# Patient Record
Sex: Female | Born: 1995 | Hispanic: Yes | Smoking: Never smoker
Health system: Southern US, Community
[De-identification: ages and names within clinical notes are randomized; demographics above are authoritative.]

## PROBLEM LIST (undated history)

## (undated) ENCOUNTER — Inpatient Hospital Stay: Payer: Self-pay

## (undated) DIAGNOSIS — Z87440 Personal history of urinary (tract) infections: Secondary | ICD-10-CM

## (undated) DIAGNOSIS — Z789 Other specified health status: Secondary | ICD-10-CM

## (undated) HISTORY — PX: NO PAST SURGERIES: SHX2092

## (undated) HISTORY — DX: Personal history of urinary (tract) infections: Z87.440

## (undated) HISTORY — PX: OTHER SURGICAL HISTORY: SHX169

---
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
## 2018-04-21 LAB — HM PAP SMEAR

## 2019-02-25 ENCOUNTER — Ambulatory Visit: Payer: Self-pay

## 2019-05-06 ENCOUNTER — Ambulatory Visit (LOCAL_COMMUNITY_HEALTH_CENTER): Payer: Self-pay

## 2019-05-06 ENCOUNTER — Other Ambulatory Visit: Payer: Self-pay

## 2019-05-06 VITALS — BP 104/61 | Ht 60.0 in | Wt 115.0 lb

## 2019-05-06 DIAGNOSIS — Z3201 Encounter for pregnancy test, result positive: Secondary | ICD-10-CM

## 2019-05-06 DIAGNOSIS — Z3200 Encounter for pregnancy test, result unknown: Secondary | ICD-10-CM

## 2019-05-06 LAB — PREGNANCY, URINE: Preg Test, Ur: POSITIVE — AB

## 2019-05-06 MED ORDER — PRENATAL VITAMIN 27-0.8 MG PO TABS: 1.0000 | ORAL_TABLET | Freq: Every day | ORAL | 0 refills | Status: DC

## 2019-05-11 NOTE — Progress Notes (Signed)
Pt with positive UPT. Pt desires care at [facility] and desires preadmit. Pt sent up front for preadmit per pt request.Netanel Yannuzzi Eleonore Chiquito, RN

## 2019-06-01 ENCOUNTER — Encounter: Payer: Self-pay | Admitting: *Deleted

## 2019-06-01 ENCOUNTER — Other Ambulatory Visit: Payer: Self-pay

## 2019-06-01 DIAGNOSIS — Z3A09 9 weeks gestation of pregnancy: Secondary | ICD-10-CM | POA: Insufficient documentation

## 2019-06-01 DIAGNOSIS — R1030 Lower abdominal pain, unspecified: Secondary | ICD-10-CM | POA: Insufficient documentation

## 2019-06-01 DIAGNOSIS — N3000 Acute cystitis without hematuria: Secondary | ICD-10-CM | POA: Insufficient documentation

## 2019-06-01 DIAGNOSIS — O219 Vomiting of pregnancy, unspecified: Secondary | ICD-10-CM | POA: Insufficient documentation

## 2019-06-01 LAB — COMPREHENSIVE METABOLIC PANEL
ALT: 19 U/L (ref 0–44)
AST: 19 U/L (ref 15–41)
Albumin: 4.4 g/dL (ref 3.5–5.0)
Alkaline Phosphatase: 55 U/L (ref 38–126)
Anion gap: 12 (ref 5–15)
BUN: 6 mg/dL (ref 6–20)
CO2: 21 mmol/L — ABNORMAL LOW (ref 22–32)
Calcium: 9.5 mg/dL (ref 8.9–10.3)
Chloride: 102 mmol/L (ref 98–111)
Creatinine, Ser: 0.43 mg/dL — ABNORMAL LOW (ref 0.44–1.00)
GFR calc Af Amer: >60 mL/min (ref >60)
GFR calc non Af Amer: >60 mL/min (ref >60)
Glucose, Bld: 79 mg/dL (ref 70–99)
Potassium: 3.8 mmol/L (ref 3.5–5.1)
Sodium: 135 mmol/L (ref 135–145)
Total Bilirubin: 0.7 mg/dL (ref 0.3–1.2)
Total Protein: 8 g/dL (ref 6.5–8.1)

## 2019-06-01 LAB — CBC
HCT: 38.3 % (ref 36.0–46.0)
Hemoglobin: 13.3 g/dL (ref 12.0–15.0)
MCH: 29.6 pg (ref 26.0–34.0)
MCHC: 34.7 g/dL (ref 30.0–36.0)
MCV: 85.1 fL (ref 80.0–100.0)
Platelets: 164 10*3/uL (ref 150–400)
RBC: 4.5 MIL/uL (ref 3.87–5.11)
RDW: 12.1 % (ref 11.5–15.5)
WBC: 7 10*3/uL (ref 4.0–10.5)
nRBC: 0 % (ref 0.0–0.2)

## 2019-06-01 LAB — URINALYSIS, COMPLETE (UACMP) WITH MICROSCOPIC
Bilirubin Urine: NEGATIVE
Glucose, UA: NEGATIVE mg/dL
Hgb urine dipstick: NEGATIVE
Ketones, ur: 80 mg/dL — AB
Nitrite: POSITIVE — AB
Protein, ur: 30 mg/dL — AB
Specific Gravity, Urine: 1.027 (ref 1.005–1.030)
pH: 6 (ref 5.0–8.0)

## 2019-06-01 LAB — LIPASE, BLOOD: Lipase: 36 U/L (ref 11–51)

## 2019-06-01 LAB — HCG, QUANTITATIVE, PREGNANCY: hCG, Beta Chain, Quant, S: 195954 m[IU]/mL — ABNORMAL HIGH (ref <5)

## 2019-06-01 NOTE — ED Triage Notes (Signed)
Pt is pregnant approx 10 weeks.  Pt reports n/v for 2 weeks.  No vaginal bleeding.  No urinary sx.  Pt has low abd pain.  Pt alert  Speech clear.  Interpreter on a stick used.

## 2019-06-02 ENCOUNTER — Emergency Department: Payer: Self-pay

## 2019-06-02 ENCOUNTER — Emergency Department
Admission: EM | Admit: 2019-06-02 | Discharge: 2019-06-02 | Disposition: A | Discharge status: Home / Self Care | Payer: Self-pay | Attending: Emergency Medicine | Admitting: Emergency Medicine

## 2019-06-02 DIAGNOSIS — O219 Vomiting of pregnancy, unspecified: Secondary | ICD-10-CM

## 2019-06-02 DIAGNOSIS — R1030 Lower abdominal pain, unspecified: Secondary | ICD-10-CM

## 2019-06-02 DIAGNOSIS — N3000 Acute cystitis without hematuria: Secondary | ICD-10-CM

## 2019-06-02 LAB — WET PREP, GENITAL
Clue Cells Wet Prep HPF POC: NONE SEEN
Sperm: NONE SEEN
Trich, Wet Prep: NONE SEEN
Yeast Wet Prep HPF POC: NONE SEEN

## 2019-06-02 MED ORDER — METOCLOPRAMIDE HCL 10 MG PO TABS
10.0000 mg | ORAL_TABLET | Freq: Once | ORAL | Status: AC
  Administered 2019-06-02: 10 mg via ORAL
  Filled 2019-06-02: qty 1

## 2019-06-02 MED ORDER — NITROFURANTOIN MONOHYD MACRO 100 MG PO CAPS
100.0000 mg | ORAL_CAPSULE | Freq: Once | ORAL | Status: AC
  Administered 2019-06-02: 100 mg via ORAL
  Filled 2019-06-02: qty 1

## 2019-06-02 MED ORDER — NITROFURANTOIN MONOHYD MACRO 100 MG PO CAPS
100.0000 mg | ORAL_CAPSULE | Freq: Two times a day (BID) | ORAL | 0 refills | Status: AC
Start: 2019-06-02 — End: 2019-06-09

## 2019-06-02 MED ORDER — METOCLOPRAMIDE HCL 10 MG PO TABS: 10.0000 mg | ORAL_TABLET | Freq: Three times a day (TID) | ORAL | 0 refills | Status: DC | PRN

## 2019-06-02 NOTE — ED Notes (Signed)
E-signature not working at this time. Pt verbalized understanding of D/C instructions, prescriptions and follow up care with no further questions at this time. Pt in NAD and ambulatory at time of D/C.  

## 2019-06-02 NOTE — ED Provider Notes (Signed)
[facility] Emergency Department Provider Note  ____________________________________________  Time seen: Approximately 2:50 AM  I have reviewed the triage vital signs and the nursing notes.   HISTORY  Chief Complaint Emesis   HPI Norma NephewClaudia Wessells is a 23 y.o. female G2, P1 at estimated [redacted] weeks gestational age per LMP who presents for evaluation of nausea, vomiting and abdominal pain.  Her symptoms have been ongoing for 2 weeks.  She is complaining of constant nausea that is worse in the morning, associated with several daily episodes of nonbloody nonbilious emesis.  She also describes intermittent pressure-like pain in the suprapubic region that last several seconds to a few minutes at a time.  No vaginal discharge or vaginal bleeding.  Patient has not established care for this pregnancy yet.  No dysuria or hematuria, no fever or chills.  Past Medical History:  Diagnosis Date  . Medical history non-contributory     Patient Active Problem List   Diagnosis Date Noted  . Premature uterine contractions 02/05/2015  . Abdominal pain in pregnancy 02/05/2015  . Normal labor 02/05/2015  . Indication for care in labor and delivery, antepartum 01/15/2015    Past Surgical History:  Procedure Laterality Date  . NO PAST SURGERIES      Prior to Admission medications   Medication Sig Start Date End Date Taking? Authorizing Provider  ibuprofen (ADVIL,MOTRIN) 600 MG tablet Take 1 tablet (600 mg total) by mouth every 6 (six) hours. Patient not taking: Reported on 05/06/2019 02/08/15   Schermerhorn, Ihor Austinhomas J, MD  medroxyPROGESTERone (DEPO-PROVERA) 150 MG/ML injection Inject 150 mg into the muscle every 3 (three) months. 07/07/18 10/27/18  [provider]  metoCLOPramide (REGLAN) 10 MG tablet Take 1 tablet (10 mg total) by mouth every 8 (eight) hours as needed for up to 3 days for nausea. 06/02/19 06/05/19  Nita SickleVeronese, [place], MD  nitrofurantoin,  macrocrystal-monohydrate, (MACROBID) 100 MG capsule Take 1 capsule (100 mg total) by mouth 2 (two) times daily for 7 days. 06/02/19 06/09/19  Nita SickleVeronese, [place], MD  Prenatal Vit-Fe Fumarate-FA (PRENATAL VITAMIN) 27-0.8 MG TABS Take 1 tablet by mouth daily. 05/06/19 08/14/19  Federico FlakeNewton, Kimberly Niles, MD    Allergies Patient has no known allergies.  No family history on file.  Social History Social History   Tobacco Use  . Smoking status: Never Smoker  . Smokeless tobacco: Never Used  Substance Use Topics  . Alcohol use: No  . Drug use: No    Review of Systems  Constitutional: Negative for fever. Eyes: Negative for visual changes. ENT: Negative for sore throat. Neck: No neck pain  Cardiovascular: Negative for chest pain. Respiratory: Negative for shortness of breath. Gastrointestinal: + suprapubic abdominal pain, nausea, and vomiting. No diarrhea. Genitourinary: Negative for dysuria. Musculoskeletal: Negative for back pain. Skin: Negative for rash. Neurological: Negative for headaches, weakness or numbness. Psych: No SI or HI  ____________________________________________   PHYSICAL EXAM:  VITAL SIGNS: ED Triage Vitals  Enc Vitals Group     BP 06/01/19 1946 115/71     Pulse Rate 06/01/19 1946 96     Resp 06/01/19 1946 18     Temp 06/01/19 1946 99.4 F (37.4 C)     Temp Source 06/01/19 1946 Oral     SpO2 06/01/19 1946 100 %     Weight 06/01/19 1954 115 lb (52.2 kg)     Height 06/01/19 1954 5\' 4"  (1.626 m)     Head Circumference --      Peak Flow --  Pain Score 06/01/19 1954 8     Pain Loc --      Pain Edu? --      Excl. in [place]? --     Constitutional: Alert and oriented. Well appearing and in no apparent distress. HEENT:      Head: Normocephalic and atraumatic.         Eyes: Conjunctivae are normal. Sclera is non-icteric.       Mouth/Throat: Mucous membranes are moist.       Neck: Supple with no signs of meningismus. Cardiovascular: Regular rate and  rhythm. No murmurs, gallops, or rubs. 2+ symmetrical distal pulses are present in all extremities. No JVD. Respiratory: Normal respiratory effort. Lungs are clear to auscultation bilaterally. No wheezes, crackles, or rhonchi.  Gastrointestinal: Soft, non tender, and non distended with positive bowel sounds. No rebound or guarding. Pelvic exam: Normal external genitalia, no rashes or lesions. Normal cervical mucus. Os closed. No cervical motion tenderness.  No uterine or adnexal tenderness.   Musculoskeletal: Nontender with normal range of motion in all extremities. No edema, cyanosis, or erythema of extremities. Neurologic: Normal speech and language. Face is symmetric. Moving all extremities. No gross focal neurologic deficits are appreciated. Skin: Skin is warm, dry and intact. No rash noted. Psychiatric: Mood and affect are normal. Speech and behavior are normal.  ____________________________________________   LABS (all labs ordered are listed, but only abnormal results are displayed)  Labs Reviewed  COMPREHENSIVE METABOLIC PANEL - Abnormal; Notable for the following components:      Result Value   CO2 21 (*)    Creatinine, Ser 0.43 (*)    All other components within normal limits  URINALYSIS, COMPLETE (UACMP) WITH MICROSCOPIC - Abnormal; Notable for the following components:   Color, Urine YELLOW (*)    APPearance HAZY (*)    Ketones, ur 80 (*)    Protein, ur 30 (*)    Nitrite POSITIVE (*)    Leukocytes,Ua TRACE (*)    Bacteria, UA RARE (*)    All other components within normal limits  HCG, QUANTITATIVE, PREGNANCY - Abnormal; Notable for the following components:   hCG, Beta Chain, Quant, S 195,954 (*)    All other components within normal limits  URINE CULTURE  WET PREP, GENITAL  CHLAMYDIA/NGC RT PCR ([facility] ONLY)  LIPASE, BLOOD  CBC   ____________________________________________  EKG  none  ____________________________________________  RADIOLOGY  I have  personally reviewed the images performed during this visit and I agree with the Radiologist's read.   Interpretation by Radiologist:  US Ob Less Than 14 Weeks With Ob Transvaginal  Result Date: 06/02/2019 CLINICAL DATA:  Lower abdominal pain for 1 day EXAM: OBSTETRIC <14 WK [place] AND TRANSVAGINAL OB US TECHNIQUE: Both transabdominal and transvaginal ultrasound examinations were performed for complete evaluation of the gestation as well as the maternal uterus, adnexal regions, and pelvic cul-de-sac. Transvaginal technique was performed to assess early pregnancy. COMPARISON:  None. FINDINGS: LMP: 03/23/2019 GA by LMP: 10 w  1 d EDC by LMP: 12/28/2019 Intrauterine gestational sac: Single Yolk sac:  Visualized. Embryo:  Visualized. Cardiac Activity: Visualized. Heart Rate: 169 bpm CRL:  27.2 mm   9 w   4 d                  [place] EDC: 01/01/2020 Subchorionic hemorrhage:  None visualized. Maternal uterus/adnexae: Probable corpus luteum seen in the left ovary measuring up to 1.9 cm. Ovaries and adnexal structures are otherwise unremarkable. Maternal uterus is free  of acute abnormality. No free fluid IMPRESSION: Single intrauterine gestation at 9 weeks 4 days estimated gestational age by crown-rump length measurement. Electronically Signed   By: Kreg Shropshire M.D.   On: 06/02/2019 02:01     ____________________________________________   PROCEDURES  Procedure(s) performed: None Procedures Critical Care performed:  None ____________________________________________   INITIAL IMPRESSION / ASSESSMENT AND PLAN / ED COURSE  23 y.o. female G70, P1 at estimated [redacted] weeks gestational age per LMP who presents for evaluation of nausea, vomiting and suprapubic abdominal pain.  Patient is extremely well-appearing in no distress with normal vital signs, abdomen is soft with no tenderness, pelvic exam is normal.  No vaginal bleeding. Transvaginal ultrasound showing a single IUP at 9 weeks and 4 days with no complications.   Wet prep, chlamydia and gonorrhea are pending.  UA positive for UTI.  No signs of sepsis.  Urine was sent for culture. Patient started on macrobid. Reglan given for nausea and vomiting most likely due to pregnancy. Discussed prenatal care.  Patient has an appointment to establish care on December 1.  Discussed my standard return precautions for new or worsening abdominal pain, flank pain, fever, intractable nausea and vomiting.       As part of my medical decision making, I reviewed the following data within the electronic MEDICAL RECORD NUMBER Nursing notes reviewed and incorporated, Labs reviewed , Old chart reviewed, Radiograph reviewed , Notes from prior ED visits and [place] Controlled Substance Database   Please note:  Patient was evaluated in Emergency Department today for the symptoms described in the history of present illness. Patient was evaluated in the context of the global COVID-19 pandemic, which necessitated consideration that the patient might be at risk for infection with the SARS-CoV-2 virus that causes COVID-19. Institutional protocols and algorithms that pertain to the evaluation of patients at risk for COVID-19 are in a state of rapid change based on information released by regulatory bodies including the CDC and federal and state organizations. These policies and algorithms were followed during the patient's care in the ED.  Some ED evaluations and interventions may be delayed as a result of limited staffing during the pandemic.   ____________________________________________   FINAL CLINICAL IMPRESSION(S) / ED DIAGNOSES   Final diagnoses:  Nausea and vomiting during pregnancy  Acute cystitis without hematuria      NEW MEDICATIONS STARTED DURING THIS VISIT:  ED Discharge Orders         Ordered    metoCLOPramide (REGLAN) 10 MG tablet  Every 8 hours PRN     06/02/19 0255    nitrofurantoin, macrocrystal-monohydrate, (MACROBID) 100 MG capsule  2 times daily     06/02/19 0255            Note:  This document was prepared using Dragon voice recognition software and may include unintentional dictation errors.    Nita Sickle, MD 06/02/19 [phone]

## 2019-06-02 NOTE — ED Notes (Signed)
Pt to u/s via w/c accomp by u/s tech 

## 2019-06-03 LAB — URINE CULTURE: Culture: >=100000 — AB

## 2019-06-05 LAB — GC/CHLAMYDIA PROBE AMP
Chlamydia trachomatis, NAA: NEGATIVE
Neisseria Gonorrhoeae by PCR: NEGATIVE

## 2019-06-09 ENCOUNTER — Ambulatory Visit: Payer: Medicaid Other | Admitting: Advanced Practice Midwife

## 2019-06-09 ENCOUNTER — Other Ambulatory Visit: Payer: Self-pay

## 2019-06-09 ENCOUNTER — Encounter: Payer: Self-pay | Admitting: Advanced Practice Midwife

## 2019-06-09 VITALS — BP 100/65 | Temp 99.1°F | Wt 105.0 lb

## 2019-06-09 DIAGNOSIS — Z23 Encounter for immunization: Secondary | ICD-10-CM

## 2019-06-09 DIAGNOSIS — O0990 Supervision of high risk pregnancy, unspecified, unspecified trimester: Secondary | ICD-10-CM | POA: Insufficient documentation

## 2019-06-09 DIAGNOSIS — O2341 Unspecified infection of urinary tract in pregnancy, first trimester: Secondary | ICD-10-CM

## 2019-06-09 DIAGNOSIS — Z3481 Encounter for supervision of other normal pregnancy, first trimester: Secondary | ICD-10-CM

## 2019-06-09 DIAGNOSIS — R636 Underweight: Secondary | ICD-10-CM | POA: Insufficient documentation

## 2019-06-09 DIAGNOSIS — O2340 Unspecified infection of urinary tract in pregnancy, unspecified trimester: Secondary | ICD-10-CM | POA: Insufficient documentation

## 2019-06-09 LAB — URINALYSIS
Bilirubin, UA: POSITIVE — AB
Glucose, UA: NEGATIVE
Leukocytes,UA: NEGATIVE
Nitrite, UA: NEGATIVE
RBC, UA: NEGATIVE
Specific Gravity, UA: 1.03 (ref 1.005–1.030)
Urobilinogen, Ur: 0.2 mg/dL (ref 0.2–1.0)
pH, UA: 5.5 (ref 5.0–7.5)

## 2019-06-09 LAB — WET PREP FOR TRICH, YEAST, CLUE
Trichomonas Exam: NEGATIVE
Yeast Exam: NEGATIVE

## 2019-06-09 LAB — HEMOGLOBIN, FINGERSTICK: Hemoglobin: 12.8 g/dL (ref 11.1–15.9)

## 2019-06-09 LAB — OB RESULTS CONSOLE HIV ANTIBODY (ROUTINE TESTING): HIV: NONREACTIVE

## 2019-06-09 MED ORDER — ONDANSETRON HCL 8 MG PO TABS: 8.0000 mg | ORAL_TABLET | Freq: Three times a day (TID) | ORAL | 0 refills | Status: DC | PRN

## 2019-06-09 NOTE — Progress Notes (Signed)
Patient here for new OB visit at 75 1/7. Phone interview done previously by R. Lassiter. Negative PPD 12/24/2014, no international travel since then. [facility] ED 06/02/2019 due to N/V, U/S done at that time. Also GC/Chl. Done and UTI diagnosed and treatment. Patient states she has finished UTI medication.Jenetta Downer, RN

## 2019-06-09 NOTE — Progress Notes (Signed)
[place] [id number] [phone]  INITIAL PRENATAL VISIT NOTE  Subjective:  Norma Becker is a 23 y.o. G3P1011 at [redacted]w[redacted]d being seen today to start prenatal care at the [facility] Department. 23 yo nonsmoking SHF feels "good" about surprise pregnancy with no birth control.  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 She is unemployed and living with her parents and child. Last ETOH 02/2019 (1 [place]).  Been to ER x1 06/02/19 and given Reglan which she says is not working, and diagnosed with UTI and has finished Macrobid.  Had 1 u/s then at 9 4/7 wks.  Pap due 2022.  She is currently monitored for the following issues for this low-risk pregnancy and has Underweight BMI=18.0; UTI (urinary tract infection) during pregnancy dx'd [facility] ER 06/02/19; and Supervision of normal intrauterine pregnancy in multigravida in first trimester on their problem list.  Patient reports no complaints & N&V--last vomited 3x today.  Has only eaten crackers today.  Contractions: Not present. Vag. Bleeding: None.  Movement: Absent. Denies leaking of fluid.   The following portions of the patient's history were reviewed and updated as appropriate: allergies, current medications, past family history, past medical history, past social history, past surgical history and problem list. Problem list updated.  Objective:   Vitals:   06/09/19 1404  BP: 100/65  Temp: 99.1 F (37.3 C)  Weight: 105 lb (47.6 kg)    Fetal Status: Fetal Heart Rate (bpm): 160 Fundal Height: 10 cm Movement: Absent      Physical Exam Vitals signs and nursing note reviewed.  Constitutional:      General: She is not in acute distress.    Appearance: Normal appearance. She is well-developed.  HENT:     Head: Normocephalic and atraumatic.     Right Ear: External  ear normal.     Left Ear: External ear normal.     Nose: Nose normal. No congestion or rhinorrhea.     Mouth/Throat:     Lips: Pink.     Mouth: Mucous membranes are moist.     Dentition: Normal dentition. No dental caries.     Pharynx: Oropharynx is clear. Uvula midline.  Eyes:     General: No scleral icterus.    Conjunctiva/sclera: Conjunctivae normal.  Neck:     Thyroid: No thyroid mass or thyromegaly.  Cardiovascular:     Rate and Rhythm: Normal rate.     Pulses: Normal pulses.     Comments: Extremities are warm and well perfused Pulmonary:     Effort: Pulmonary effort is normal.     Breath sounds: Normal breath sounds.  Chest:     Breasts: Breasts are symmetrical.        Right: Normal. No mass, nipple discharge or skin change.        Left: Normal. No mass, nipple discharge or skin change.  Abdominal:     General: Abdomen is flat.     Palpations: Abdomen is soft.     Tenderness: There is no abdominal tenderness.     Comments: Gravid 10 wks size, FHR 160, soft without tenderness, +striae, poor tone  Genitourinary:    General: Normal vulva.     Exam position: Lithotomy position.     Pubic Area: No rash.      Labia:        Right: No  rash.        Left: No rash.      Vagina: Vaginal discharge (white mucousy, ph<4.5) present.     Cervix: No cervical motion tenderness or friability.     Uterus: Normal. Enlarged (Gravid 10-12 weeks size). Not tender.      Adnexa: Right adnexa normal and left adnexa normal.     Rectum: Normal. No external hemorrhoid.  Musculoskeletal:     Right lower leg: No edema.     Left lower leg: No edema.  Lymphadenopathy:     Upper Body:     Right upper body: No axillary adenopathy.     Left upper body: No axillary adenopathy.  Skin:    General: Skin is warm.     Capillary Refill: Capillary refill takes less than 2 seconds.  Neurological:     Mental Status: She is alert.     Assessment and Plan:  Pregnancy: G3P1011 at [redacted]w[redacted]d  1. Underweight  BMI=18.0   2. Urinary tract infection in mother during first trimester of pregnancy Dx'd in [facility] ER 06/02/19 and completed Macrobid.  C&S TOC today  3. Supervision of normal intrauterine pregnancy in multigravida in first trimester C/o N&V daily (3x today) and has only eaten crackers today--suggestions given and rx for Zofran #12. - Prenatal profile without Varicella/Rubella ([id number]) - HIV [place] [facility] - Urine Culture - WET PREP FOR TRICH, YEAST, CLUE - Urinalysis (Urine Dip) - Hemoglobin, venipuncture - ondansetron (ZOFRAN) 8 MG tablet; Take 1 tablet (8 mg total) by mouth every 8 (eight) hours as needed for nausea or vomiting.  Dispense: 12 tablet; Refill: 0    Discussed overview of care and coordination with inpatient delivery practices including [facility], [facility], [facility] and [facility].   Reviewed Centering pregnancy as standard of care at [facility], oriented to room and showed video. Based on EDD, plan for Cycle .    Preterm labor symptoms and general obstetric precautions including but not limited to vaginal bleeding, contractions, leaking of fluid and fetal movement were reviewed in detail with the patient.  Please refer to After Visit Summary for other counseling recommendations.   Return in about 4 weeks (around 07/07/2019) for routine PNC.  No future appointments.  Alberteen Spindle, CNM

## 2019-06-09 NOTE — Progress Notes (Addendum)
Flu vaccine given, right deltoid, tolerated well, VIS given. Written prescription for Zofran copied for scanning. NCIR in [employer] traffic [employer]. Wet mount reviewed, no treatment indicated.Jenetta Downer, RN

## 2019-06-10 LAB — CBC/D/PLT+RPR+RH+ABO+AB SCR
Antibody Screen: NEGATIVE
Basophils Absolute: 0 10*3/uL (ref 0.0–0.2)
Basos: 0 %
EOS (ABSOLUTE): 0 10*3/uL (ref 0.0–0.4)
Eos: 0 %
Hematocrit: 38.4 % (ref 34.0–46.6)
Hemoglobin: 13 g/dL (ref 11.1–15.9)
Hepatitis B Surface Ag: NEGATIVE
Immature Grans (Abs): 0 10*3/uL (ref 0.0–0.1)
Immature Granulocytes: 0 %
Lymphocytes Absolute: 1.5 10*3/uL (ref 0.7–3.1)
Lymphs: 23 %
MCH: 29.9 pg (ref 26.6–33.0)
MCHC: 33.9 g/dL (ref 31.5–35.7)
MCV: 88 fL (ref 79–97)
Monocytes Absolute: 0.3 10*3/uL (ref 0.1–0.9)
Monocytes: 5 %
Neutrophils Absolute: 4.5 10*3/uL (ref 1.4–7.0)
Neutrophils: 72 %
Platelets: 182 10*3/uL (ref 150–450)
RBC: 4.35 x10E6/uL (ref 3.77–5.28)
RDW: 12.5 % (ref 11.7–15.4)
RPR Ser Ql: NONREACTIVE
Rh Factor: POSITIVE
WBC: 6.4 10*3/uL (ref 3.4–10.8)

## 2019-06-10 NOTE — Addendum Note (Signed)
Addended by: Donnal Moat on: 06/10/2019 10:04 AM   Modules accepted: Orders

## 2019-06-11 LAB — URINE CULTURE

## 2019-06-18 ENCOUNTER — Telehealth: Payer: Self-pay | Admitting: Dietician

## 2019-06-18 ENCOUNTER — Encounter: Payer: Self-pay | Admitting: Dietician

## 2019-06-18 NOTE — Telephone Encounter (Signed)
Spoke with client via telephone due to MNT referral for underweight in pregnancy.  Encouraged eating 2-3 times daily, including good fats, adequate hydration/fiber, and reviewed tips for managing nausea.  Client not billed for MNT services due to required [facility] contacts unmet.  See [facility] EHR for notes.

## 2019-07-07 ENCOUNTER — Other Ambulatory Visit: Payer: Self-pay

## 2019-07-07 ENCOUNTER — Encounter: Payer: Self-pay | Admitting: Family Medicine

## 2019-07-07 ENCOUNTER — Ambulatory Visit: Payer: Self-pay | Admitting: Family Medicine

## 2019-07-07 VITALS — BP 97/59 | HR 83 | Temp 97.2°F | Wt 107.8 lb

## 2019-07-07 DIAGNOSIS — Z3481 Encounter for supervision of other normal pregnancy, first trimester: Secondary | ICD-10-CM

## 2019-07-07 DIAGNOSIS — R636 Underweight: Secondary | ICD-10-CM | POA: Diagnosis not present

## 2019-07-07 NOTE — Progress Notes (Signed)
  PRENATAL VISIT NOTE  Subjective:  Norma Becker is a 23 y.o. G3P1011 at [redacted]w[redacted]d being seen today for ongoing prenatal care.  She is currently monitored for the following issues for this low-risk pregnancy and has Underweight BMI=18.0; UTI (urinary tract infection) during pregnancy dx'd [facility] ER 06/02/19; and Supervision of normal intrauterine pregnancy in multigravida in first trimester on their problem list.  Patient reports no complaints.  Contractions: Not present. Vag. Bleeding: None.  Movement: Absent. Denies leaking of fluid/ROM.   The following portions of the patient's history were reviewed and updated as appropriate: allergies, current medications, past family history, past medical history, past social history, past surgical history and problem list. Problem list updated.  Objective:   Vitals:   07/07/19 1038  BP: (!) 97/59  Pulse: 83  Temp: (!) 97.2 F (36.2 C)  Weight: 107 lb 12.8 oz (48.9 kg)    Fetal Status: Fetal Heart Rate (bpm): 160 Fundal Height: 14 cm Movement: Absent     General:  Alert, oriented and cooperative. Patient is in no acute distress.  Skin: Skin is warm and dry. No rash noted.   Cardiovascular: Normal heart rate noted  Respiratory: Normal respiratory effort, no problems with respiration noted  Abdomen: Soft, gravid, appropriate for gestational age.  Pain/Pressure: Absent     Pelvic: Cervical exam deferred        Extremities: Normal range of motion.  Edema: None  Mental Status: Normal mood and affect. Normal behavior. Normal judgment and thought content.   Assessment and Plan:  Pregnancy: G3P1011 at [redacted]w[redacted]d   1. Supervision of normal intrauterine pregnancy in multigravida in first trimester Up to date. Will refer for anatomy u/s through [facility] today.  2. Underweight BMI=18.0 Has gained 2 lbs since last visit 1 month ago. Continues to see MNT, appetite has increased since last visit, less n/v. Handout of pregnancy safe OTC nausea medications and  instructions including unisom and vitamin b6 given to pt.     Preterm labor symptoms and general obstetric precautions including but not limited to vaginal bleeding, contractions, leaking of fluid and fetal movement were reviewed in detail with the patient. Please refer to After Visit Summary for other counseling recommendations.  Return in about 4 weeks (around 08/04/2019) for routine prenatal care.  Future Appointments  Date Time Provider [place]  08/04/2019  3:00 PM [facility] PROVIDER [facility] None    Kandee Keen, PA-C

## 2019-07-07 NOTE — Patient Instructions (Signed)

## 2019-07-07 NOTE — Progress Notes (Signed)
Here today for 14.4 week MH RV. Taking PNV QD, denies ED/hospital visits since last RV. Hal Morales, RN

## 2019-07-10 NOTE — L&D Delivery Note (Signed)
Delivery Note  First Stage: Labor onset: 0030 Augmentation : AROM Analgesia /Anesthesia intrapartum: none AROM at 0655  Second Stage: Complete dilation at 0840 Onset of pushing at 0848 FHR second stage 135 bpm  Delivery of a viable female infant on 12/25/19 at 0848 by CNM delivery of fetal head in ROA position with restitution to ROT. Loose nuchal cord x 1;  Anterior then posterior shoulders delivered easily with gentle downward traction. Baby placed on mom's chest, and attended to by peds.  Cord double clamped after cessation of pulsation, cut by FOB Cord blood sample collected   Third Stage: Placenta delivered spontaneously intact with 3VC @ [phone] Placenta disposition: routine disposal Uterine tone Firm / bleeding scant  No vaginal, cervical or perineal lacerations identified  Est. Blood Loss (mL): 150  Complications: none  Mom to postpartum.  Baby to Couplet care / Skin to Skin.  Newborn: Birth Weight: 7#13  Apgar Scores: 7/9 Feeding planned: both

## 2019-07-14 ENCOUNTER — Other Ambulatory Visit: Payer: Self-pay | Admitting: Family Medicine

## 2019-07-14 DIAGNOSIS — Z3482 Encounter for supervision of other normal pregnancy, second trimester: Secondary | ICD-10-CM

## 2019-07-14 DIAGNOSIS — Z3402 Encounter for supervision of normal first pregnancy, second trimester: Secondary | ICD-10-CM

## 2019-08-03 ENCOUNTER — Ambulatory Visit
Admission: RE | Admit: 2019-08-03 | Discharge: 2019-08-03 | Disposition: A | Discharge status: Home / Self Care | Payer: Medicaid Other | Source: Ambulatory Visit | Attending: Family Medicine | Admitting: Family Medicine

## 2019-08-03 ENCOUNTER — Other Ambulatory Visit: Payer: Self-pay

## 2019-08-03 DIAGNOSIS — Z3482 Encounter for supervision of other normal pregnancy, second trimester: Secondary | ICD-10-CM | POA: Insufficient documentation

## 2019-08-04 ENCOUNTER — Encounter: Payer: Self-pay | Admitting: Family Medicine

## 2019-08-04 ENCOUNTER — Ambulatory Visit: Payer: Self-pay | Admitting: Family Medicine

## 2019-08-04 VITALS — BP 89/54 | HR 72 | Temp 97.9°F | Wt 109.4 lb

## 2019-08-04 DIAGNOSIS — R636 Underweight: Secondary | ICD-10-CM

## 2019-08-04 DIAGNOSIS — Z3482 Encounter for supervision of other normal pregnancy, second trimester: Secondary | ICD-10-CM

## 2019-08-04 DIAGNOSIS — Z3481 Encounter for supervision of other normal pregnancy, first trimester: Secondary | ICD-10-CM

## 2019-08-04 LAB — URINALYSIS
Bilirubin, UA: NEGATIVE
Glucose, UA: NEGATIVE
Ketones, UA: NEGATIVE
Leukocytes,UA: NEGATIVE
Nitrite, UA: NEGATIVE
Protein,UA: NEGATIVE
Specific Gravity, UA: 1.03 (ref 1.005–1.030)
Urobilinogen, Ur: 0.2 mg/dL (ref 0.2–1.0)
pH, UA: 6 (ref 5.0–7.5)

## 2019-08-04 MED ORDER — PRENATAL VITAMIN 27-0.8 MG PO TABS: 1.0000 | ORAL_TABLET | Freq: Every day | ORAL | 0 refills | Status: DC

## 2019-08-04 NOTE — Progress Notes (Signed)
   PRENATAL VISIT NOTE  Subjective:  Norma Becker is a 24 y.o. G3P1011 at [redacted]w[redacted]d being seen today for ongoing prenatal care.  She is currently monitored for the following issues for this low-risk pregnancy and has Underweight BMI=18.0; UTI (urinary tract infection) during pregnancy dx'd [facility] ER 06/02/19; and Supervision of normal intrauterine pregnancy in multigravida in first trimester on their problem list.  Patient reports no complaints.  Contractions: Not present. Vag. Bleeding: None.  Movement: Absent. Denies leaking of fluid/ROM.   The following portions of the patient's history were reviewed and updated as appropriate: allergies, current medications, past family history, past medical history, past social history, past surgical history and problem list. Problem list updated.  Objective:   Vitals:   08/04/19 1459  BP: (!) 89/54  Pulse: 72  Temp: 97.9 F (36.6 C)  Weight: 109 lb 6.4 oz (49.6 kg)    Fetal Status: Fetal Heart Rate (bpm): 150 Fundal Height: 18 cm Movement: Absent     General:  Alert, oriented and cooperative. Patient is in no acute distress.  Skin: Skin is warm and dry. No rash noted.   Cardiovascular: Normal heart rate noted  Respiratory: Normal respiratory effort, no problems with respiration noted  Abdomen: Soft, gravid, appropriate for gestational age.  Pain/Pressure: Absent     Pelvic: Cervical exam deferred        Extremities: Normal range of motion.  Edema: None  Mental Status: Normal mood and affect. Normal behavior. Normal judgment and thought content.   Assessment and Plan:  Pregnancy: G3P1011 at [redacted]w[redacted]d  1. Supervision of normal intrauterine pregnancy in multigravida, second trimester -Up to date. We discussed recent US results, wnl.  -Quad screen today. -UA from 06/09/19 w/1+ protein, repeat today w/out protein - QUAD Screen [facility] Only - Urinalysis (Urine Dip) - Prenatal Vit-Fe Fumarate-FA (PRENATAL VITAMIN) 27-0.8 MG TABS; Take 1 tablet by  mouth daily at 6 (six) AM.  Dispense: 100 tablet; Refill: 0  2. Underweight BMI=18.0 -Nausea has passed, states she has more appetite and is eating better.  -2 lb wt gain from last month. TWG = -5 lb 9.6 oz (-2.54 kg)    Preterm labor symptoms and general obstetric precautions including but not limited to vaginal bleeding, contractions, leaking of fluid and fetal movement were reviewed in detail with the patient. Please refer to After Visit Summary for other counseling recommendations.  Return in about 4 weeks (around 09/01/2019) for routine prenatal care.  Future Appointments  Date Time Provider Department Center  09/01/2019  3:20 PM [facility] PROVIDER [facility] None    Ann Held, PA-C

## 2019-08-04 NOTE — Patient Instructions (Signed)
[id number] 

## 2019-08-04 NOTE — Progress Notes (Signed)
Taking PNV QD. Denies international travel for self or FOB since pregnant. Counseled regarding Quad screen - drawn today. Jossie Ng, RN

## 2019-08-11 ENCOUNTER — Encounter: Payer: Self-pay | Admitting: Advanced Practice Midwife

## 2019-08-11 DIAGNOSIS — O280 Abnormal hematological finding on antenatal screening of mother: Secondary | ICD-10-CM | POA: Insufficient documentation

## 2019-08-12 ENCOUNTER — Other Ambulatory Visit: Payer: Self-pay | Admitting: Advanced Practice Midwife

## 2019-08-12 DIAGNOSIS — O280 Abnormal hematological finding on antenatal screening of mother: Secondary | ICD-10-CM

## 2019-08-12 DIAGNOSIS — O3508X0 Maternal care for (suspected) central nervous system malformation or damage in fetus, spina bifida, not applicable or unspecified: Secondary | ICD-10-CM

## 2019-08-12 DIAGNOSIS — O350XX0 Maternal care for (suspected) central nervous system malformation in fetus, not applicable or unspecified: Secondary | ICD-10-CM

## 2019-08-20 ENCOUNTER — Other Ambulatory Visit: Payer: Self-pay | Admitting: Obstetrics and Gynecology

## 2019-08-20 ENCOUNTER — Other Ambulatory Visit: Payer: Self-pay | Admitting: Advanced Practice Midwife

## 2019-08-20 DIAGNOSIS — O350XX0 Maternal care for (suspected) central nervous system malformation in fetus, not applicable or unspecified: Secondary | ICD-10-CM

## 2019-08-20 DIAGNOSIS — O3508X0 Maternal care for (suspected) central nervous system malformation or damage in fetus, spina bifida, not applicable or unspecified: Secondary | ICD-10-CM

## 2019-08-20 DIAGNOSIS — O280 Abnormal hematological finding on antenatal screening of mother: Secondary | ICD-10-CM

## 2019-08-24 ENCOUNTER — Ambulatory Visit (HOSPITAL_BASED_OUTPATIENT_CLINIC_OR_DEPARTMENT_OTHER)
Admission: RE | Admit: 2019-08-24 | Discharge: 2019-08-24 | Disposition: A | Discharge status: Home / Self Care | Payer: Self-pay | Source: Ambulatory Visit | Attending: Obstetrics and Gynecology | Admitting: Obstetrics and Gynecology

## 2019-08-24 ENCOUNTER — Other Ambulatory Visit: Payer: Self-pay

## 2019-08-24 ENCOUNTER — Ambulatory Visit
Admission: RE | Admit: 2019-08-24 | Discharge: 2019-08-24 | Disposition: A | Discharge status: Home / Self Care | Payer: Self-pay | Source: Ambulatory Visit | Attending: Advanced Practice Midwife | Admitting: Advanced Practice Midwife

## 2019-08-24 DIAGNOSIS — O350XX0 Maternal care for (suspected) central nervous system malformation in fetus, not applicable or unspecified: Secondary | ICD-10-CM | POA: Insufficient documentation

## 2019-08-24 DIAGNOSIS — O280 Abnormal hematological finding on antenatal screening of mother: Secondary | ICD-10-CM | POA: Insufficient documentation

## 2019-08-24 DIAGNOSIS — Z3A21 21 weeks gestation of pregnancy: Secondary | ICD-10-CM | POA: Insufficient documentation

## 2019-08-24 DIAGNOSIS — O3508X0 Maternal care for (suspected) central nervous system malformation or damage in fetus, spina bifida, not applicable or unspecified: Secondary | ICD-10-CM

## 2019-08-24 DIAGNOSIS — R772 Abnormality of alphafetoprotein: Secondary | ICD-10-CM

## 2019-08-24 NOTE — Progress Notes (Signed)
Referring Physician: [facility] [facility] Department Length of Consultation: 25 minutes   [name] was referred for genetic counseling to discuss the results of her Maternal Serum Screen and her prenatal testing options.  This note is a summary of our discussion. A Spanish interpreter was utilized for the visit.  To review, a Maternal Serum Screen is a maternal blood test that measures four pregnancy proteins: AFP, hCG, uE3 and DIA.  The levels of these proteins can sometimes help determine if a pregnancy is at higher risk for certain birth defects or problems; however, it cannot diagnose or rule out these conditions.  It is important to remember that an abnormal maternal serum screen (MSS) test does not necessarily mean that the baby has a problem.  Maternal serum screening can identify approximately 80% of open neural tube defects (i.e., spina bifida), 75-80% of pregnancies with Down syndrome (Trisomy 21), and 60% of pregnancies with Edward syndrome (Trisomy 18).  The neural tube consists of the fetal head and spine, and if this structure fails to close during development, spina bifida (open spine) or anencephaly (open skull) could result.  Chromosomes are the inherited structures that contain our instructions for development (genes).  Extra or missing chromosomes can cause mental retardation and physical differences.  The MSS result revealed an increased level of AFP.  The value was 3.76 MOM (multiples of the median) which is above the cutoff of 2.5 MOM.  Increased levels of AFP occur for many reasons including: inaccurate pregnancy dating, the presence of twins, pregnancy complications, neural tube defects, abdominal wall defects or a normal pregnancy.  Before MSS screening, her risk to have a child with a neural tube defect was 1 or 2 per 1000. Given the elevated AFP value, the risk is now 1 in 7.    We discussed the following prenatal testing options for this pregnancy, and the patient  received written material which reviews amniocentesis in detail:  Ultrasound was recommended to evaluate the fetal anatomy, particularly the neural tube and to confirm the gestational age.  An ultrasound performed on 08/24/2019 confirmed that the patient was [redacted] weeks pregnant.  There was no evidence of a neural tube defect on this ultrasound. The detailed fetal anatomy appeared normal, although this cannot rule out all abnormalities. There was a finding on the fetal surface of the placenta, possibly a placental lake.  See the ultrasound reported for details.  Amniocentesis was offered because the AFP MoM exceeded a particular laboratory cutoff.  Amniocentesis can detect greater than 98% of neural tube defects.  It involves the removal of a small amount of amniotic fluid from the sac surrounding the fetus with the use of a thin needle through the maternal abdomen and uterus.  Fetal cells are directly evaluated and approximately 99% of chromosome conditions may also be detected.  Ultrasound guidance is used throughout the procedure. The main risks to this procedure include complications leading to miscarriage in less than 1 in 200 cases (0.5%).    It is important to remember that each pregnancy in the general population has a 2-3% chance for a birth defect that might not be detected prenatally.  We recommend 0.4mg  of folic acid, a B-complex vitamin, for all women of childbearing age.  Folic acid may help to prevent neural tube defects, and should be taken prior to and during pregnancy.  We obtained a family history, and the patient reported no family members with known genetic conditions, mental retardation or birth defects.  We  also obtained a detailed pregnancy history. [name] reported no complications thus far and deny the use of prescription medications, recreational drugs, tobacco or alcohol.  After careful consideration, the patient elected to decline amniocentesis.  Unexplained  elevations of maternal serum AFP have been associated with adverse pregnancy outcomes such as low birth weight, preterm labor, pre-eclampsia or fetal demise.  For this reason, another ultrasound is recommended at 26 to [redacted] weeks gestation to reassess fetal growth.  We appreciate the opportunity to be involved in the care of this patient.  If she has any further questions or concerns, please do not hesitate to call us at [phone].   Wilburt Finlay, MS, CGC

## 2019-09-01 ENCOUNTER — Encounter: Payer: Self-pay | Admitting: Family Medicine

## 2019-09-01 ENCOUNTER — Other Ambulatory Visit: Payer: Self-pay

## 2019-09-01 ENCOUNTER — Ambulatory Visit: Payer: Self-pay | Admitting: Family Medicine

## 2019-09-01 VITALS — BP 108/54 | HR 81 | Temp 97.7°F | Wt 115.8 lb

## 2019-09-01 DIAGNOSIS — Z3481 Encounter for supervision of other normal pregnancy, first trimester: Secondary | ICD-10-CM

## 2019-09-01 DIAGNOSIS — O280 Abnormal hematological finding on antenatal screening of mother: Secondary | ICD-10-CM

## 2019-09-01 DIAGNOSIS — R636 Underweight: Secondary | ICD-10-CM

## 2019-09-01 NOTE — Progress Notes (Signed)
  PRENATAL VISIT NOTE  Subjective:  Norma Becker is a 24 y.o. G3P1011 at [redacted]w[redacted]d being seen today for ongoing prenatal care.  She is currently monitored for the following issues for this low-risk pregnancy and has Underweight BMI=18.0; UTI (urinary tract infection) during pregnancy dx'd [facility] ER 06/02/19; Supervision of normal intrauterine pregnancy in multigravida in first trimester; and  Abnormal quad screen with positive open spina bifida 1:7 at 18 4/7 on their problem list.  Patient reports no complaints.  Contractions: Not present. Vag. Bleeding: None.  Movement: Present. Denies leaking of fluid/ROM.   The following portions of the patient's history were reviewed and updated as appropriate: allergies, current medications, past family history, past medical history, past social history, past surgical history and problem list. Problem list updated.  Objective:   Vitals:   09/01/19 1520  BP: (!) 108/54  Pulse: 81  Temp: 97.7 F (36.5 C)  Weight: 115 lb 12.8 oz (52.5 kg)    Fetal Status: Fetal Heart Rate (bpm): 150 Fundal Height: 22 cm Movement: Present     General:  Alert, oriented and cooperative. Patient is in no acute distress.  Skin: Skin is warm and dry. No rash noted.   Cardiovascular: Normal heart rate noted  Respiratory: Normal respiratory effort, no problems with respiration noted  Abdomen: Soft, gravid, appropriate for gestational age.  Pain/Pressure: Absent     Pelvic: Cervical exam deferred        Extremities: Normal range of motion.  Edema: None  Mental Status: Normal mood and affect. Normal behavior. Normal judgment and thought content.   Assessment and Plan:  Pregnancy: G3P1011 at [redacted]w[redacted]d    1. Supervision of normal intrauterine pregnancy in multigravida in first trimester -Up to date.  -PHQ-9 score 0 today.   2.  Abnormal quad screen with positive open spina bifida 1:7 at 18 4/7 -Has seen [facility], recent US without abnormalities. Pt has f/u US March 18  for f/u growth d/t abn quad  3. Underweight BMI=18.0 TWG = 12.8 oz (0.363 kg) We discussed optimal weight gain in pregnancy. She states she has a good appetite, feeling well.    Preterm labor symptoms and general obstetric precautions including but not limited to vaginal bleeding, contractions, leaking of fluid and fetal movement were reviewed in detail with the patient. Please refer to After Visit Summary for other counseling recommendations.  Return in about 4 weeks (around 09/29/2019) for routine prenatal care.  Future Appointments  Date Time Provider Department Center  09/24/2019 11:00 AM [facility] [place] [facility]    Ann Held, PA-C  Overview Addendum 08/25/2019 4:55 PM by Ann Held, PA-C  08/24/19: [facility] perinatal detailed [place] = No fetal abnormalities. +large placental lake [facility] recommendations: [ ]  Needs serial growth ultrasounds (next in 4 wks) [ ]  consider induction at 39-40 wks

## 2019-09-01 NOTE — Progress Notes (Signed)
Here today for 22.4 week MH RV. Taking PNV QD, denies ED/hospital visits since last RV. CCNC and PHQ 9 forms today. Tawny Hopping, RN

## 2019-09-21 ENCOUNTER — Other Ambulatory Visit: Payer: Self-pay | Admitting: Maternal & Fetal Medicine

## 2019-09-21 DIAGNOSIS — O3508X0 Maternal care for (suspected) central nervous system malformation or damage in fetus, spina bifida, not applicable or unspecified: Secondary | ICD-10-CM

## 2019-09-21 DIAGNOSIS — O280 Abnormal hematological finding on antenatal screening of mother: Secondary | ICD-10-CM

## 2019-09-21 DIAGNOSIS — O350XX0 Maternal care for (suspected) central nervous system malformation in fetus, not applicable or unspecified: Secondary | ICD-10-CM

## 2019-09-24 ENCOUNTER — Other Ambulatory Visit: Payer: Self-pay

## 2019-09-24 ENCOUNTER — Ambulatory Visit
Admission: RE | Admit: 2019-09-24 | Discharge: 2019-09-24 | Disposition: A | Discharge status: Home / Self Care | Payer: Self-pay | Source: Ambulatory Visit | Attending: Maternal & Fetal Medicine | Admitting: Maternal & Fetal Medicine

## 2019-09-24 DIAGNOSIS — O350XX0 Maternal care for (suspected) central nervous system malformation in fetus, not applicable or unspecified: Secondary | ICD-10-CM

## 2019-09-24 DIAGNOSIS — O280 Abnormal hematological finding on antenatal screening of mother: Secondary | ICD-10-CM

## 2019-09-24 DIAGNOSIS — O3508X0 Maternal care for (suspected) central nervous system malformation or damage in fetus, spina bifida, not applicable or unspecified: Secondary | ICD-10-CM

## 2019-09-29 ENCOUNTER — Other Ambulatory Visit: Payer: Self-pay

## 2019-09-29 ENCOUNTER — Ambulatory Visit: Payer: Self-pay | Admitting: Family Medicine

## 2019-09-29 VITALS — BP 107/56 | HR 75 | Temp 97.9°F | Wt 119.4 lb

## 2019-09-29 DIAGNOSIS — O280 Abnormal hematological finding on antenatal screening of mother: Secondary | ICD-10-CM

## 2019-09-29 DIAGNOSIS — O0990 Supervision of high risk pregnancy, unspecified, unspecified trimester: Secondary | ICD-10-CM

## 2019-09-29 NOTE — Progress Notes (Signed)
Patient here for MH RV at 26 4/7. S/S PTL reviewed and literature given. Patient unsure about BCM, states she has BCM pamphlet.Burt Knack, RN

## 2019-09-29 NOTE — Progress Notes (Signed)
   PRENATAL VISIT NOTE  Subjective:  Norma Becker is a 24 y.o. G3P1011 at [redacted]w[redacted]d being seen today for ongoing prenatal care.  She is currently monitored for the following issues for this low-risk pregnancy and has Underweight BMI=18.0; UTI (urinary tract infection) during pregnancy dx'd [facility] ER 06/02/19; Supervision of high risk pregnancy, antepartum; and  Abnormal quad screen with positive open spina bifida 1:7 at 18 4/7 on their problem list.  Patient reports no complaints.  Contractions: Not present. Vag. Bleeding: None.  Movement: Present. Denies leaking of fluid/ROM.   The following portions of the patient's history were reviewed and updated as appropriate: allergies, current medications, past family history, past medical history, past social history, past surgical history and problem list. Problem list updated.  Objective:   Vitals:   09/29/19 1626  BP: (!) 107/56  Pulse: 75  Temp: 97.9 F (36.6 C)  Weight: 119 lb 6.4 oz (54.2 kg)    Fetal Status: Fetal Heart Rate (bpm): 150 Fundal Height: 26 cm Movement: Present     General:  Alert, oriented and cooperative. Patient is in no acute distress.  Skin: Skin is warm and dry. No rash noted.   Cardiovascular: Normal heart rate noted  Respiratory: Normal respiratory effort, no problems with respiration noted  Abdomen: Soft, gravid, appropriate for gestational age.  Pain/Pressure: Absent     Pelvic: Cervical exam deferred        Extremities: Normal range of motion.  Edema: None  Mental Status: Normal mood and affect. Normal behavior. Normal judgment and thought content.   Assessment and Plan:  Pregnancy: G3P1011 at [redacted]w[redacted]d  1.  Abnormal quad screen with positive open spina bifida 1:7 at 18 4/7 Repeat growth [place] was WNL Follow FH and refer for repeat US as indicated Per MFM, most likely related to placental lake seen on 2/15 exam.   2. Supervision of high risk pregnancy, antepartum TWG=4 lb 6.4 oz (1.996 kg) which is below  goal but improved since last visit Encouraged increased calorie intake Reviewed use of WIC Discussed next visit with GDM screening  Preterm labor symptoms and general obstetric precautions including but not limited to vaginal bleeding, contractions, leaking of fluid and fetal movement were reviewed in detail with the patient. Please refer to After Visit Summary for other counseling recommendations.   Return in about 2 weeks (around 10/13/2019) for Routine prenatal care, 28 wk labs.  Future Appointments  Date Time Provider Department Center  11/05/2019  9:00 AM [facility] [place] [facility]    Federico Flake, MD

## 2019-10-13 ENCOUNTER — Ambulatory Visit: Payer: Medicaid Other

## 2019-10-13 ENCOUNTER — Ambulatory Visit: Payer: Medicaid Other | Admitting: Family Medicine

## 2019-10-13 ENCOUNTER — Other Ambulatory Visit: Payer: Self-pay

## 2019-10-13 VITALS — BP 106/68 | HR 103 | Temp 98.5°F | Wt 121.0 lb

## 2019-10-13 DIAGNOSIS — O0990 Supervision of high risk pregnancy, unspecified, unspecified trimester: Secondary | ICD-10-CM

## 2019-10-13 DIAGNOSIS — O280 Abnormal hematological finding on antenatal screening of mother: Secondary | ICD-10-CM

## 2019-10-13 DIAGNOSIS — R636 Underweight: Secondary | ICD-10-CM

## 2019-10-13 LAB — HEMOGLOBIN, FINGERSTICK: Hemoglobin: 11.7 g/dL (ref 11.1–15.9)

## 2019-10-13 LAB — OB RESULTS CONSOLE HIV ANTIBODY (ROUTINE TESTING): HIV: NONREACTIVE

## 2019-10-13 NOTE — Progress Notes (Signed)
In for visit; taking PNV; denies hospital visits since last appt; Peggyann Juba, RPR, HIV today; agrees to Tdap Sharlette Dense, RN

## 2019-10-13 NOTE — Progress Notes (Signed)
PRENATAL VISIT NOTE  Subjective:  Norma Becker is a 24 y.o. G3P1011 at [redacted]w[redacted]d being seen today for ongoing prenatal care.  She is currently monitored for the following issues for this low-risk pregnancy and has Underweight BMI=18.0; UTI (urinary tract infection) during pregnancy dx'd [facility] 06/02/19; Supervision of high risk pregnancy, antepartum; and  Abnormal quad screen with positive open spina bifida 1:7 at 18 4/7 on their problem list.  Patient reports no complaints.  Contractions: Not present. Vag. Bleeding: None.  Movement: Present. Denies leaking of fluid/ROM.   The following portions of the patient's history were reviewed and updated as appropriate: allergies, current medications, past family history, past medical history, past social history, past surgical history and problem list. Problem list updated.  Objective:   Vitals:   10/13/19 1330  BP: 106/68  Pulse: (!) 103  Temp: 98.5 F (36.9 C)  Weight: 121 lb (54.9 kg)    Fetal Status: Fetal Heart Rate (bpm): 140 Fundal Height: 29 cm Movement: Present     General:  Alert, oriented and cooperative. Patient is in no acute distress.  Skin: Skin is warm and dry. No rash noted.   Cardiovascular: Normal heart rate noted  Respiratory: Normal respiratory effort, no problems with respiration noted  Abdomen: Soft, gravid, appropriate for gestational age.  Pain/Pressure: Absent     Pelvic: Cervical exam deferred        Extremities: Normal range of motion.  Edema: None  Mental Status: Normal mood and affect. Normal behavior. Normal judgment and thought content.   Assessment and Plan:  Pregnancy: G3P1011 at [redacted]w[redacted]d  1. Supervision of high risk pregnancy, antepartum Up to date currently - Glucose, 1 hour gestational - RPR - HIV [place] [facility] - Hemoglobin, venipuncture  2.  Abnormal quad screen with positive open spina bifida 1:7 at 18 4/7 US wnl Has follow up US 4/29  3. Underweight BMI=18.0 TWG=6 lb (2.722 kg)   Encouraged WIC and increased snacks   Preterm labor symptoms and general obstetric precautions including but not limited to vaginal bleeding, contractions, leaking of fluid and fetal movement were reviewed in detail with the patient. Please refer to After Visit Summary for other counseling recommendations.   Return in about 2 weeks (around 10/27/2019) for Routine prenatal care.  Future Appointments  Date Time Provider Department Center  11/05/2019  9:00 AM [facility] [place] [facility]    Federico Flake, MD

## 2019-10-14 LAB — RPR: RPR Ser Ql: NONREACTIVE

## 2019-10-14 LAB — GLUCOSE, 1 HOUR GESTATIONAL: Gestational Diabetes Screen: 72 mg/dL (ref 65–139)

## 2019-10-27 ENCOUNTER — Encounter: Payer: Self-pay | Admitting: Family Medicine

## 2019-10-27 ENCOUNTER — Other Ambulatory Visit: Payer: Self-pay

## 2019-10-27 ENCOUNTER — Ambulatory Visit: Payer: Medicaid Other | Admitting: Family Medicine

## 2019-10-27 VITALS — BP 100/65 | HR 83 | Temp 97.7°F | Wt 125.6 lb

## 2019-10-27 DIAGNOSIS — O280 Abnormal hematological finding on antenatal screening of mother: Secondary | ICD-10-CM

## 2019-10-27 DIAGNOSIS — R636 Underweight: Secondary | ICD-10-CM

## 2019-10-27 DIAGNOSIS — O0990 Supervision of high risk pregnancy, unspecified, unspecified trimester: Secondary | ICD-10-CM

## 2019-10-27 MED ORDER — PRENATAL VITAMINS 28-0.8 MG PO TABS: 1.0000 | ORAL_TABLET | Freq: Every day | ORAL | 0 refills | Status: AC

## 2019-10-27 NOTE — Progress Notes (Signed)
  PRENATAL VISIT NOTE  Subjective:  Norma Becker is a 24 y.o. G3P1011 at [redacted]w[redacted]d being seen today for ongoing prenatal care.  She is currently monitored for the following issues for this high-risk pregnancy and has Underweight BMI=18.0; UTI (urinary tract infection) during pregnancy dx'd [facility] ER 06/02/19; Supervision of high risk pregnancy, antepartum; and  Abnormal quad screen with positive open spina bifida 1:7 at 18 4/7 on their problem list.  Patient reports no complaints.  Contractions: Not present. Vag. Bleeding: None.  Movement: Present. Denies leaking of fluid/ROM.   The following portions of the patient's history were reviewed and updated as appropriate: allergies, current medications, past family history, past medical history, past social history, past surgical history and problem list. Problem list updated.  Objective:   Vitals:   10/27/19 1332  BP: 100/65  Pulse: 83  Temp: 97.7 F (36.5 C)  Weight: 125 lb 9.6 oz (57 kg)    Fetal Status: Fetal Heart Rate (bpm): 145 Fundal Height: 29 cm Movement: Present     General:  Alert, oriented and cooperative. Patient is in no acute distress.  Skin: Skin is warm and dry. No rash noted.   Cardiovascular: Normal heart rate noted  Respiratory: Normal respiratory effort, no problems with respiration noted  Abdomen: Soft, gravid, appropriate for gestational age.  Pain/Pressure: Absent     Pelvic: Cervical exam deferred        Extremities: Normal range of motion.  Edema: None  Mental Status: Normal mood and affect. Normal behavior. Normal judgment and thought content.   Assessment and Plan:  Pregnancy: G3P1011 at [redacted]w[redacted]d  1. Supervision of high risk pregnancy, antepartum -Reviewed 28 wk labs - all WNL.  - Prenatal Vit-Fe Fumarate-FA (PRENATAL VITAMINS) 28-0.8 MG TABS; Take 1 tablet by mouth daily.  Dispense: 100 tablet; Refill: 0  2. Underweight BMI=18.0 10 lb 9.6 oz (4.808 kg) -Weight increasing, pt endorses more snacks. Has  not yet seen [facility], was encouraged to do so.  3.  Abnormal quad screen with positive open spina bifida 1:7 at 18 4/7 -Most recent US wnl, pt has repeat 4/29.     Preterm labor symptoms and general obstetric precautions including but not limited to vaginal bleeding, contractions, leaking of fluid and fetal movement were reviewed in detail with the patient. Please refer to After Visit Summary for other counseling recommendations.  Return in about 2 weeks (around 11/10/2019) for routine prenatal care.  Future Appointments  Date Time Provider Department Center  11/05/2019  9:00 AM [facility] [place] [facility]  11/10/2019  2:00 PM [facility] PROVIDER [facility] None    Ann Held, PA-C

## 2019-10-27 NOTE — Progress Notes (Signed)
In for visit; taking PNV (more given today); denies hospital visits since last appt Sharlette Dense, RN

## 2019-11-02 ENCOUNTER — Other Ambulatory Visit: Payer: Self-pay | Admitting: Maternal & Fetal Medicine

## 2019-11-02 DIAGNOSIS — Z3689 Encounter for other specified antenatal screening: Secondary | ICD-10-CM

## 2019-11-05 ENCOUNTER — Other Ambulatory Visit: Payer: Self-pay

## 2019-11-05 ENCOUNTER — Ambulatory Visit: Payer: Self-pay

## 2019-11-05 ENCOUNTER — Ambulatory Visit
Admission: RE | Admit: 2019-11-05 | Discharge: 2019-11-05 | Disposition: A | Discharge status: Home / Self Care | Payer: Medicaid Other | Source: Ambulatory Visit | Attending: Obstetrics and Gynecology | Admitting: Obstetrics and Gynecology

## 2019-11-05 DIAGNOSIS — Z3689 Encounter for other specified antenatal screening: Secondary | ICD-10-CM

## 2019-11-05 DIAGNOSIS — Z3A31 31 weeks gestation of pregnancy: Secondary | ICD-10-CM | POA: Diagnosis not present

## 2019-11-05 DIAGNOSIS — O0990 Supervision of high risk pregnancy, unspecified, unspecified trimester: Secondary | ICD-10-CM

## 2019-11-10 ENCOUNTER — Ambulatory Visit: Payer: Medicaid Other | Admitting: Family Medicine

## 2019-11-10 ENCOUNTER — Encounter: Payer: Self-pay | Admitting: Family Medicine

## 2019-11-10 ENCOUNTER — Other Ambulatory Visit: Payer: Self-pay

## 2019-11-10 VITALS — BP 101/61 | HR 85 | Temp 96.8°F | Wt 127.4 lb

## 2019-11-10 DIAGNOSIS — O0990 Supervision of high risk pregnancy, unspecified, unspecified trimester: Secondary | ICD-10-CM

## 2019-11-10 DIAGNOSIS — O280 Abnormal hematological finding on antenatal screening of mother: Secondary | ICD-10-CM

## 2019-11-10 DIAGNOSIS — R636 Underweight: Secondary | ICD-10-CM

## 2019-11-10 NOTE — Progress Notes (Signed)
  PRENATAL VISIT NOTE  Subjective:  Norma Becker is a 24 y.o. G3P1011 at [redacted]w[redacted]d being seen today for ongoing prenatal care.  She is currently monitored for the following issues for this low-risk pregnancy and has Underweight BMI=18.0; UTI (urinary tract infection) during pregnancy dx'd [facility] 06/02/19; Supervision of high risk pregnancy, antepartum; and  Abnormal quad screen with positive open spina bifida 1:7 at 18 4/7 on their problem list.  Patient reports no complaints.  Contractions: Not present. Vag. Bleeding: None.  Movement: Present. Denies leaking of fluid/ROM.   The following portions of the patient's history were reviewed and updated as appropriate: allergies, current medications, past family history, past medical history, past social history, past surgical history and problem list. Problem list updated.  Objective:   Vitals:   11/10/19 1407  BP: 101/61  Pulse: 85  Temp: (!) 96.8 F (36 C)  Weight: 127 lb 6.4 oz (57.8 kg)    Fetal Status: Fetal Heart Rate (bpm): 150 Fundal Height: 31 cm Movement: Present     General:  Alert, oriented and cooperative. Patient is in no acute distress.  Skin: Skin is warm and dry. No rash noted.   Cardiovascular: Normal heart rate noted  Respiratory: Normal respiratory effort, no problems with respiration noted  Abdomen: Soft, gravid, appropriate for gestational age.  Pain/Pressure: Absent     Pelvic: Cervical exam deferred        Extremities: Normal range of motion.  Edema: None  Mental Status: Normal mood and affect. Normal behavior. Normal judgment and thought content.   Assessment and Plan:  Pregnancy: G3P1011 at [redacted]w[redacted]d   1. Supervision of high risk pregnancy, antepartum -Up to date -Reviewed kick counts.  -Reviewed s/sx of pre-eclampsia and to seek medical care ASAP if present.  -PP contraception plan is depo -Pediatrician is [address] [facility] [employer] plan is breastfeeding  2. Underweight BMI=18.0 TWG = 12 lb 6.4 oz (5.625  kg)  3.  Abnormal quad screen with positive open spina bifida 1:7 at 18 4/7 -[facility] [place] 4/29 = fluid collection no longer seen. Has f/u [facility] [place] growth d/t hx abnormal AFP on 11/26/19.     Preterm labor symptoms and general obstetric precautions including but not limited to vaginal bleeding, contractions, leaking of fluid and fetal movement were reviewed in detail with the patient. Please refer to After Visit Summary for other counseling recommendations.  Return in about 2 weeks (around 11/24/2019) for routine prenatal care.  Future Appointments  Date Time Provider Department Center  11/24/2019  1:20 PM [facility] PROVIDER [facility] None  11/26/2019  2:00 PM [facility] [place] [facility]    Jhade Berko L Jaeden Messer, PA-C

## 2019-11-10 NOTE — Progress Notes (Signed)
Here today for 32.4 week MH RV. Taking PNV QD, denies ED/hospital visits since last RV. Kick Count cards and instructions given and explained. Aware of scheduled f/u DP ultrasound appt 11/26/19 @ 2:00. Tawny Hopping, RN

## 2019-11-23 ENCOUNTER — Other Ambulatory Visit: Payer: Self-pay | Admitting: Maternal & Fetal Medicine

## 2019-11-23 DIAGNOSIS — E6609 Other obesity due to excess calories: Secondary | ICD-10-CM

## 2019-11-24 ENCOUNTER — Ambulatory Visit: Payer: Medicaid Other | Admitting: Advanced Practice Midwife

## 2019-11-24 ENCOUNTER — Encounter: Payer: Self-pay | Admitting: Advanced Practice Midwife

## 2019-11-24 ENCOUNTER — Other Ambulatory Visit: Payer: Self-pay

## 2019-11-24 VITALS — BP 105/68 | HR 107 | Temp 98.0°F | Wt 130.6 lb

## 2019-11-24 DIAGNOSIS — O280 Abnormal hematological finding on antenatal screening of mother: Secondary | ICD-10-CM

## 2019-11-24 DIAGNOSIS — O0990 Supervision of high risk pregnancy, unspecified, unspecified trimester: Secondary | ICD-10-CM

## 2019-11-24 DIAGNOSIS — R636 Underweight: Secondary | ICD-10-CM

## 2019-11-24 NOTE — Progress Notes (Signed)
   PRENATAL VISIT NOTE  Subjective:  Norma Becker is a 24 y.o. G3P1011 at [redacted]w[redacted]d being seen today for ongoing prenatal care.  She is currently monitored for the following issues for this high-risk pregnancy and has Underweight BMI=18.0; UTI (urinary tract infection) during pregnancy dx'd [facility] 06/02/19; Supervision of high risk pregnancy, antepartum; and  Abnormal quad screen with positive open spina bifida 1:7 at 18 4/7 on their problem list.  Patient reports no complaints.  Contractions: Not present. Vag. Bleeding: None.  Movement: Present. Denies leaking of fluid/ROM.   The following portions of the patient's history were reviewed and updated as appropriate: allergies, current medications, past family history, past medical history, past social history, past surgical history and problem list. Problem list updated.  Objective:   Vitals:   11/24/19 1305  BP: 105/68  Pulse: (!) 107  Temp: 98 F (36.7 C)  Weight: 130 lb 9.6 oz (59.2 kg)    Fetal Status: Fetal Heart Rate (bpm): 150 Fundal Height: 33 cm Movement: Present     General:  Alert, oriented and cooperative. Patient is in no acute distress.  Skin: Skin is warm and dry. No rash noted.   Cardiovascular: Normal heart rate noted  Respiratory: Normal respiratory effort, no problems with respiration noted  Abdomen: Soft, gravid, appropriate for gestational age.  Pain/Pressure: Absent     Pelvic: Cervical exam deferred        Extremities: Normal range of motion.  Edema: None  Mental Status: Normal mood and affect. Normal behavior. Normal judgment and thought content.   Assessment and Plan:  Pregnancy: G3P1011 at [redacted]w[redacted]d  1. Supervision of high risk pregnancy, antepartum Feels well.  No c/o.  Care utd  2.  Abnormal quad screen with positive open spina bifida 1:7 at 18 4/7 Pt reminded of f/u growth u/s 11/26/19  3. Underweight BMI=18.0 15 lb 9.6 oz (7.076 kg)    Preterm labor symptoms and general obstetric precautions  including but not limited to vaginal bleeding, contractions, leaking of fluid and fetal movement were reviewed in detail with the patient. Please refer to After Visit Summary for other counseling recommendations.  No follow-ups on file.  Future Appointments  Date Time Provider Department Center  11/26/2019 10:00 AM [facility] [place] [facility]  12/08/2019  2:20 PM [facility] PROVIDER [facility] None    Alberteen Spindle, CNM

## 2019-11-24 NOTE — Progress Notes (Signed)
In for visit; taking PNV; denies hospital visits since last appt; aware of 11/26/19 u/s appt Sharlette Dense, RN

## 2019-11-26 ENCOUNTER — Other Ambulatory Visit: Payer: Self-pay

## 2019-11-26 ENCOUNTER — Ambulatory Visit
Admission: RE | Admit: 2019-11-26 | Discharge: 2019-11-26 | Disposition: A | Discharge status: Home / Self Care | Payer: Medicaid Other | Source: Ambulatory Visit | Attending: Maternal & Fetal Medicine | Admitting: Maternal & Fetal Medicine

## 2019-11-26 DIAGNOSIS — O99213 Obesity complicating pregnancy, third trimester: Secondary | ICD-10-CM | POA: Insufficient documentation

## 2019-11-26 DIAGNOSIS — O0990 Supervision of high risk pregnancy, unspecified, unspecified trimester: Secondary | ICD-10-CM

## 2019-11-26 DIAGNOSIS — E6609 Other obesity due to excess calories: Secondary | ICD-10-CM | POA: Diagnosis not present

## 2019-11-26 DIAGNOSIS — Z3A34 34 weeks gestation of pregnancy: Secondary | ICD-10-CM | POA: Diagnosis not present

## 2019-12-08 ENCOUNTER — Ambulatory Visit: Payer: Self-pay | Admitting: Family Medicine

## 2019-12-08 ENCOUNTER — Ambulatory Visit: Payer: Self-pay

## 2019-12-08 ENCOUNTER — Other Ambulatory Visit: Payer: Self-pay

## 2019-12-08 VITALS — BP 102/61 | HR 89 | Temp 97.8°F | Wt 131.6 lb

## 2019-12-08 DIAGNOSIS — R636 Underweight: Secondary | ICD-10-CM

## 2019-12-08 DIAGNOSIS — O0990 Supervision of high risk pregnancy, unspecified, unspecified trimester: Secondary | ICD-10-CM

## 2019-12-08 DIAGNOSIS — O280 Abnormal hematological finding on antenatal screening of mother: Secondary | ICD-10-CM

## 2019-12-08 NOTE — Progress Notes (Signed)
Here today for 36.4 week MH RV. Taking PNV QD, denies ED/hospital visits since last RV. 36 week labs and info today. Declines to self collect vaginal tests today. Tawny Hopping, RN

## 2019-12-08 NOTE — Progress Notes (Signed)
  PRENATAL VISIT NOTE  Subjective:  Norma Becker is a 24 y.o. G3P1011 at [redacted]w[redacted]d being seen today for ongoing prenatal care.  She is currently monitored for the following issues for this low-risk pregnancy and has Underweight BMI=18.0; UTI (urinary tract infection) during pregnancy dx'd [facility] 06/02/19; Supervision of high risk pregnancy, antepartum; and  Abnormal quad screen with positive open spina bifida 1:7 at 18 4/7 on their problem list.  Patient reports low backache.  Contractions: Not present. Vag. Bleeding: None.  Movement: Present. Denies leaking of fluid/ROM.   The following portions of the patient's history were reviewed and updated as appropriate: allergies, current medications, past family history, past medical history, past social history, past surgical history and problem list. Problem list updated.  Objective:   Vitals:   12/08/19 1517  BP: 102/61  Pulse: 89  Temp: 97.8 F (36.6 C)  Weight: 131 lb 9.6 oz (59.7 kg)    Fetal Status: Fetal Heart Rate (bpm): 145 Fundal Height: 35 cm Movement: Present  Presentation: Vertex  General:  Alert, oriented and cooperative. Patient is in no acute distress.  Skin: Skin is warm and dry. No rash noted.   Cardiovascular: Normal heart rate noted  Respiratory: Normal respiratory effort, no problems with respiration noted  Abdomen: Soft, gravid, appropriate for gestational age.  Pain/Pressure: Absent     Pelvic: Cervical exam deferred        Extremities: Normal range of motion.  Edema: None  Mental Status: Normal mood and affect. Normal behavior. Normal judgment and thought content.   Assessment and Plan:  Pregnancy: G3P1011 at [redacted]w[redacted]d   1. Supervision of high risk pregnancy, antepartum -36 wk labs today.  -PHQ-9 score 1 -Reviewed s/sx of labor, when to go to hospital  -Pt has carseat -Anticipatory guidance given regarding visits q1 wk until 40 wks. -Discussed back pain relief strategies including rest, baths, belly band,  exercises and tylenol.  - GBS Culture - Chlamydia/GC NAA, Confirmation  2. Underweight BMI=18.0 16 lb 9.6 oz (7.53 kg) Encouraged frequent meals.  3.  Abnormal quad screen with positive open spina bifida 1:7 at 18 4/7 -[place] 5/20 = no further fluid collection seen - resolved.   Preterm labor symptoms and general obstetric precautions including but not limited to vaginal bleeding, contractions, leaking of fluid and fetal movement were reviewed in detail with the patient. Please refer to After Visit Summary for other counseling recommendations.  Return in about 1 week (around 12/15/2019) for routine prenatal care.  Future Appointments  Date Time Provider Department Center  12/16/2019  1:40 PM [facility] PROVIDER [facility] None    Ann Held, PA-C

## 2019-12-09 ENCOUNTER — Encounter: Payer: Self-pay | Admitting: Family Medicine

## 2019-12-10 LAB — CHLAMYDIA/GC NAA, CONFIRMATION
Chlamydia trachomatis, NAA: NEGATIVE
Neisseria gonorrhoeae, NAA: NEGATIVE

## 2019-12-13 LAB — CULTURE, BETA STREP (GROUP B ONLY): Strep Gp B Culture: NEGATIVE

## 2019-12-16 ENCOUNTER — Ambulatory Visit: Payer: Self-pay

## 2019-12-16 ENCOUNTER — Other Ambulatory Visit: Payer: Self-pay

## 2019-12-16 ENCOUNTER — Ambulatory Visit: Payer: Medicaid Other | Admitting: Advanced Practice Midwife

## 2019-12-16 VITALS — BP 108/72 | HR 84 | Temp 98.6°F | Wt 133.0 lb

## 2019-12-16 DIAGNOSIS — R636 Underweight: Secondary | ICD-10-CM

## 2019-12-16 DIAGNOSIS — O0990 Supervision of high risk pregnancy, unspecified, unspecified trimester: Secondary | ICD-10-CM | POA: Diagnosis not present

## 2019-12-16 NOTE — Progress Notes (Signed)
   PRENATAL VISIT NOTE  Subjective:  Norma Becker is a 24 y.o. G3P1011 at [redacted]w[redacted]d being seen today for ongoing prenatal care.  She is currently monitored for the following issues for this high-risk pregnancy and has Underweight BMI=18.0; UTI (urinary tract infection) during pregnancy dx'd [facility] 06/02/19; Supervision of high risk pregnancy, antepartum; and  Abnormal quad screen with positive open spina bifida 1:7 at 18 4/7 on their problem list.  Patient reports no complaints.  Contractions: Not present. Vag. Bleeding: None.  Movement: Present. Denies leaking of fluid/ROM.   The following portions of the patient's history were reviewed and updated as appropriate: allergies, current medications, past family history, past medical history, past social history, past surgical history and problem list. Problem list updated.  Objective:   Vitals:   12/16/19 0849  BP: 108/72  Pulse: 84  Temp: 98.6 F (37 C)  Weight: 133 lb (60.3 kg)    Fetal Status: Fetal Heart Rate (bpm): 130 Fundal Height: 37 cm Movement: Present  Presentation: Vertex  General:  Alert, oriented and cooperative. Patient is in no acute distress.  Skin: Skin is warm and dry. No rash noted.   Cardiovascular: Normal heart rate noted  Respiratory: Normal respiratory effort, no problems with respiration noted  Abdomen: Soft, gravid, appropriate for gestational age.  Pain/Pressure: Absent     Pelvic: Cervical exam deferred        Extremities: Normal range of motion.  Edema: None  Mental Status: Normal mood and affect. Normal behavior. Normal judgment and thought content.   Assessment and Plan:  Pregnancy: G3P1011 at [redacted]w[redacted]d  1. Supervision of high risk pregnancy, antepartum Feels well.  Has car seat.  Knows when to go to L&D.  Not employed  2. Underweight BMI=18.0 18 lb (8.165 kg)    Preterm labor symptoms and general obstetric precautions including but not limited to vaginal bleeding, contractions, leaking of  fluid and fetal movement were reviewed in detail with the patient. Please refer to After Visit Summary for other counseling recommendations.  Return in about 1 week (around 12/23/2019) for routine PNC.  Future Appointments  Date Time Provider Department Center  12/22/2019  3:20 PM [facility] PROVIDER [facility] None    Alberteen Spindle, CNM

## 2019-12-16 NOTE — Progress Notes (Signed)
Pt denies visits to ER since last visit at [facility]. Taking PNV. 

## 2019-12-22 ENCOUNTER — Other Ambulatory Visit: Payer: Self-pay

## 2019-12-22 ENCOUNTER — Ambulatory Visit: Payer: Medicaid Other | Admitting: Advanced Practice Midwife

## 2019-12-22 DIAGNOSIS — O0990 Supervision of high risk pregnancy, unspecified, unspecified trimester: Secondary | ICD-10-CM

## 2019-12-22 NOTE — Progress Notes (Signed)
   PRENATAL VISIT NOTE  Subjective:  Norma Becker is a 24 y.o. G3P1011 at [redacted]w[redacted]d being seen today for ongoing prenatal care.  She is currently monitored for the following issues for this high-risk pregnancy and has Underweight BMI=18.0; UTI (urinary tract infection) during pregnancy dx'd [facility] 06/02/19; Supervision of high risk pregnancy, antepartum; and  Abnormal quad screen with positive open spina bifida 1:7 at 18 4/7 on their problem list.  Patient reports no complaints.  Contractions: Not present. Vag. Bleeding: None.  Movement: Present. Denies leaking of fluid/ROM.   The following portions of the patient's history were reviewed and updated as appropriate: allergies, current medications, past family history, past medical history, past social history, past surgical history and problem list. Problem list updated.  Objective:   Vitals:   12/22/19 1529  BP: 105/63  Pulse: 72  Temp: 98.1 F (36.7 C)  Weight: 133 lb 9.6 oz (60.6 kg)    Fetal Status: Fetal Heart Rate (bpm): 140 Fundal Height: 38 cm Movement: Present  Presentation: Vertex  General:  Alert, oriented and cooperative. Patient is in no acute distress.  Skin: Skin is warm and dry. No rash noted.   Cardiovascular: Normal heart rate noted  Respiratory: Normal respiratory effort, no problems with respiration noted  Abdomen: Soft, gravid, appropriate for gestational age.  Pain/Pressure: Absent     Pelvic: Cervical exam deferred        Extremities: Normal range of motion.  Edema: None  Mental Status: Normal mood and affect. Normal behavior. Normal judgment and thought content.   Assessment and Plan:  Pregnancy: G3P1011 at [redacted]w[redacted]d  1. Supervision of high risk pregnancy, antepartum Knows when to go to L&D.  Feels well.  Ready for baby at home.  Has car seat.     Term labor symptoms and general obstetric precautions including but not limited to vaginal bleeding, contractions, leaking of fluid and fetal movement were  reviewed in detail with the patient. Please refer to After Visit Summary for other counseling recommendations.  No follow-ups on file.  Future Appointments  Date Time Provider Department Center  12/29/2019  3:20 PM [facility] PROVIDER [facility] None    Alberteen Spindle, CNM

## 2019-12-25 ENCOUNTER — Other Ambulatory Visit: Payer: Self-pay

## 2019-12-25 ENCOUNTER — Inpatient Hospital Stay
Admission: AD | Admit: 2019-12-25 | Discharge: 2019-12-26 | DRG: 807 | Disposition: A | Discharge status: Home / Self Care | Payer: Medicaid Other | Attending: Obstetrics and Gynecology | Admitting: Obstetrics and Gynecology

## 2019-12-25 ENCOUNTER — Encounter: Payer: Self-pay | Admitting: Obstetrics and Gynecology

## 2019-12-25 DIAGNOSIS — O26893 Other specified pregnancy related conditions, third trimester: Secondary | ICD-10-CM | POA: Diagnosis present

## 2019-12-25 DIAGNOSIS — Z20822 Contact with and (suspected) exposure to covid-19: Secondary | ICD-10-CM | POA: Diagnosis present

## 2019-12-25 DIAGNOSIS — Z3A39 39 weeks gestation of pregnancy: Secondary | ICD-10-CM | POA: Diagnosis not present

## 2019-12-25 DIAGNOSIS — O0990 Supervision of high risk pregnancy, unspecified, unspecified trimester: Secondary | ICD-10-CM

## 2019-12-25 DIAGNOSIS — O479 False labor, unspecified: Secondary | ICD-10-CM | POA: Diagnosis present

## 2019-12-25 LAB — CBC
HCT: 38 % (ref 36.0–46.0)
Hemoglobin: 13.6 g/dL (ref 12.0–15.0)
MCH: 31 pg (ref 26.0–34.0)
MCHC: 35.8 g/dL (ref 30.0–36.0)
MCV: 86.6 fL (ref 80.0–100.0)
Platelets: 120 10*3/uL — ABNORMAL LOW (ref 150–400)
RBC: 4.39 MIL/uL (ref 3.87–5.11)
RDW: 13.9 % (ref 11.5–15.5)
WBC: 12.2 10*3/uL — ABNORMAL HIGH (ref 4.0–10.5)
nRBC: 0 % (ref 0.0–0.2)

## 2019-12-25 LAB — SARS CORONAVIRUS 2 BY RT PCR (HOSPITAL ORDER, PERFORMED IN ~~LOC~~ HOSPITAL LAB): SARS Coronavirus 2: NEGATIVE

## 2019-12-25 LAB — TYPE AND SCREEN
ABO/RH(D): O POS
Antibody Screen: NEGATIVE

## 2019-12-25 MED ORDER — ACETAMINOPHEN 325 MG PO TABS: 650.0000 mg | ORAL_TABLET | ORAL | Status: DC | PRN

## 2019-12-25 MED ORDER — OXYTOCIN-SODIUM CHLORIDE 30-0.9 UT/500ML-% IV SOLN
INTRAVENOUS | Status: AC
  Filled 2019-12-25: qty 1000

## 2019-12-25 MED ORDER — OXYCODONE-ACETAMINOPHEN 5-325 MG PO TABS: 2.0000 | ORAL_TABLET | ORAL | Status: DC | PRN

## 2019-12-25 MED ORDER — PRENATAL MULTIVITAMIN CH
1.0000 | ORAL_TABLET | Freq: Every day | ORAL | Status: DC
  Administered 2019-12-25 – 2019-12-26 (×2): 1 via ORAL
  Filled 2019-12-25 (×2): qty 1

## 2019-12-25 MED ORDER — OXYCODONE-ACETAMINOPHEN 5-325 MG PO TABS: 1.0000 | ORAL_TABLET | ORAL | Status: DC | PRN

## 2019-12-25 MED ORDER — OXYTOCIN 10 UNIT/ML IJ SOLN
INTRAMUSCULAR | Status: AC
  Filled 2019-12-25: qty 2

## 2019-12-25 MED ORDER — COCONUT OIL OIL
1.0000 "application " | TOPICAL_OIL | Status: DC | PRN
  Administered 2019-12-26: 1 via TOPICAL
  Filled 2019-12-25: qty 120

## 2019-12-25 MED ORDER — LACTATED RINGERS IV SOLN: 500.0000 mL | INTRAVENOUS | Status: DC | PRN

## 2019-12-25 MED ORDER — BUTORPHANOL TARTRATE 1 MG/ML IJ SOLN
1.0000 mg | INTRAMUSCULAR | Status: DC | PRN
  Administered 2019-12-25: 1 mg via INTRAVENOUS
  Filled 2019-12-25: qty 1

## 2019-12-25 MED ORDER — AMMONIA AROMATIC IN INHA
RESPIRATORY_TRACT | Status: AC
  Filled 2019-12-25: qty 10

## 2019-12-25 MED ORDER — LIDOCAINE HCL (PF) 1 % IJ SOLN
INTRAMUSCULAR | Status: AC
  Filled 2019-12-25: qty 30

## 2019-12-25 MED ORDER — MISOPROSTOL 200 MCG PO TABS
ORAL_TABLET | ORAL | Status: AC
  Filled 2019-12-25: qty 4

## 2019-12-25 MED ORDER — SIMETHICONE 80 MG PO CHEW: 80.0000 mg | CHEWABLE_TABLET | ORAL | Status: DC | PRN

## 2019-12-25 MED ORDER — SOD CITRATE-CITRIC ACID 500-334 MG/5ML PO SOLN: 30.0000 mL | ORAL | Status: DC | PRN

## 2019-12-25 MED ORDER — BUTORPHANOL TARTRATE 1 MG/ML IJ SOLN: 1.0000 mg | Freq: Once | INTRAMUSCULAR | Status: DC

## 2019-12-25 MED ORDER — IBUPROFEN 600 MG PO TABS
600.0000 mg | ORAL_TABLET | Freq: Four times a day (QID) | ORAL | Status: DC
  Administered 2019-12-25 – 2019-12-26 (×5): 600 mg via ORAL
  Filled 2019-12-25 (×5): qty 1

## 2019-12-25 MED ORDER — OXYTOCIN-SODIUM CHLORIDE 30-0.9 UT/500ML-% IV SOLN
2.5000 [IU]/h | INTRAVENOUS | Status: DC
  Administered 2019-12-25 (×2): 2.5 [IU]/h via INTRAVENOUS

## 2019-12-25 MED ORDER — DIPHENHYDRAMINE HCL 25 MG PO CAPS: 25.0000 mg | ORAL_CAPSULE | Freq: Four times a day (QID) | ORAL | Status: DC | PRN

## 2019-12-25 MED ORDER — LIDOCAINE HCL (PF) 1 % IJ SOLN: 30.0000 mL | INTRAMUSCULAR | Status: DC | PRN

## 2019-12-25 MED ORDER — SENNOSIDES-DOCUSATE SODIUM 8.6-50 MG PO TABS
2.0000 | ORAL_TABLET | ORAL | Status: DC
  Administered 2019-12-26: 2 via ORAL
  Filled 2019-12-25: qty 2

## 2019-12-25 MED ORDER — ONDANSETRON HCL 4 MG PO TABS: 4.0000 mg | ORAL_TABLET | ORAL | Status: DC | PRN

## 2019-12-25 MED ORDER — ACETAMINOPHEN 325 MG PO TABS
650.0000 mg | ORAL_TABLET | ORAL | Status: DC | PRN
  Administered 2019-12-25: 650 mg via ORAL
  Filled 2019-12-25: qty 2

## 2019-12-25 MED ORDER — WITCH HAZEL-GLYCERIN EX PADS: 1.0000 "application " | MEDICATED_PAD | CUTANEOUS | Status: DC | PRN

## 2019-12-25 MED ORDER — ONDANSETRON HCL 4 MG/2ML IJ SOLN
4.0000 mg | Freq: Four times a day (QID) | INTRAMUSCULAR | Status: DC | PRN
  Administered 2019-12-25: 4 mg via INTRAVENOUS
  Filled 2019-12-25: qty 2

## 2019-12-25 MED ORDER — LACTATED RINGERS IV SOLN: INTRAVENOUS | Status: DC

## 2019-12-25 MED ORDER — ONDANSETRON HCL 4 MG/2ML IJ SOLN: 4.0000 mg | INTRAMUSCULAR | Status: DC | PRN

## 2019-12-25 MED ORDER — BENZOCAINE-MENTHOL 20-0.5 % EX AERO: 1.0000 "application " | INHALATION_SPRAY | CUTANEOUS | Status: DC | PRN

## 2019-12-25 MED ORDER — ZOLPIDEM TARTRATE 5 MG PO TABS: 5.0000 mg | ORAL_TABLET | Freq: Every evening | ORAL | Status: DC | PRN

## 2019-12-25 MED ORDER — DIBUCAINE (PERIANAL) 1 % EX OINT: 1.0000 "application " | TOPICAL_OINTMENT | CUTANEOUS | Status: DC | PRN

## 2019-12-25 MED ORDER — OXYTOCIN BOLUS FROM INFUSION
333.0000 mL | Freq: Once | INTRAVENOUS | Status: AC
  Administered 2019-12-25: 333 mL via INTRAVENOUS

## 2019-12-25 NOTE — Progress Notes (Signed)
Interpreter Christiane Ha via [employer]

## 2019-12-25 NOTE — H&P (Signed)
Norma Becker is a 24 y.o. female presenting for active labor  Pregnancy complicated by Abn AFP test with reported normal follow up u/s ( ? Records) History of Zika exposure in [place] . ..  Abnormal quad screen with positive open spina bifida 1:7 at 18 4/7 Repeat growth [place] was WNL Follow FH and refer for repeat US as indicated Per MFM, most likely related to placental lake seen on 2/15 exam OB History    Gravida  3   Para  1   Term  1   Preterm  0   AB  1   Living  1     SAB  1   TAB  0   Ectopic  0   Multiple  0   Live Births             Past Medical History:  Diagnosis Date  . Medical history non-contributory    Past Surgical History:  Procedure Laterality Date  . NO PAST SURGERIES     Family History: family history includes Hypertension in her mother. Social History:  reports that she has never smoked. She has never used smokeless tobacco. She reports that she does not drink alcohol and does not use drugs.     Maternal Diabetes: No Genetic Screening: Abnormal:  Results: Elevated AFP Maternal Ultrasounds/Referrals: Normal Fetal Ultrasounds or other Referrals:  Referred to Materal Fetal Medicine  Maternal Substance Abuse:  No Significant Maternal Medications:  None Significant Maternal Lab Results:  Group B Strep negative Other Comments:  None  Review of Systems History Dilation: 8 Effacement (%): 100 Station: -2 Exam by:: Mireyah Chervenak MD Blood pressure 133/84, pulse (!) 139, temperature 98.5 F (36.9 C), temperature source Oral, last menstrual period 03/23/2019. Exam Physical Exam   Lungs cta   Cv rrr  cx arom + meconium  8 cm / 90 / 0  vtx Reassuring fetal monitoring   Prenatal labs: ABO, Rh: --/--/PENDING (06/18 0017) Antibody: PENDING (06/18 0647) Rubella:   RPR: Non Reactive (04/06 1428)  HBsAg: Negative (12/01 1607)  HIV: Non-reactive (04/06 0000)  GBS: Negative/-- (06/01 1505)   Assessment/Plan: Active labor   Anticipate SVD  Rubella + varicella labs ordered .    Norma Becker 12/25/2019, 7:31 AM

## 2019-12-25 NOTE — Discharge Summary (Signed)
Obstetrical Discharge Summary  Patient Name: Norma Becker DOB: [birth year] MRN: [id number]  Date of Admission: 12/25/2019 Date of Delivery6/18/21 Delivered by: Hassan Buckler CNM Date of Discharge: 12/26/2019  Primary OB: [facility]  WUJ:[id number]'W last menstrual period was 03/23/2019 (exact date). EDC Estimated Date of Delivery: 01/01/20 Gestational Age at Delivery: [redacted]w[redacted]d  Antepartum complications:  1. Abnormal AFP, OSB risk 1:7- normal f/u with MFM 2. [place] Admitting Diagnosis: active labor Secondary Diagnosis: SVD, nuchal cord  Patient Active Problem List   Diagnosis Date Noted  . Uterine contractions during pregnancy 12/25/2019  .  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 Underweight BMI=18.0 06/09/2019  . [facility] 06/09/2019  . Supervision of high risk pregnancy, antepartum 06/09/2019    Augmentation: AROM Complications: None Intrapartum complications/course: see delivery note, uncomplicated SVD Date of Delivery: 12/25/19 Delivered By:  RHassan BucklerCNM Delivery Type: spontaneous vaginal delivery Anesthesia: epidural Placenta: spontaneous Laceration: none Episiotomy: none Newborn Data: Live born female  Birth Weight: 7 lb 13 oz (3544 g) APGAR: 7/9   Newborn Delivery   Birth date/time: 12/25/2019 08:48:00 Delivery type: Vaginal, Spontaneous        Postpartum Procedures: none  Edinburgh: No flowsheet data found.    Post partum course:  Patient had an uncomplicated postpartum course.  By time of discharge on PPD#1, her pain was controlled on oral pain medications; she had appropriate lochia and was ambulating, voiding without difficulty and tolerating regular diet.  She was deemed stable for discharge to home.      Discharge Physical Exam:  BP 98/77 (BP Location: Left Arm)   Pulse 85   Temp 97.9 F (36.6 C) (Oral)   Resp 18   Ht  5' (1.524 m)   Wt 60.3 kg   LMP 03/23/2019 (Exact Date) Comment: normal period  SpO2 99%   Breastfeeding Unknown   BMI 25.97 kg/m   General: NAD CV: RRR Pulm: CTABL, nl effort ABD: s/nd/nt, fundus firm and below the umbilicus Lochia: moderate Perineum: well approximated/intact DVT Evaluation: LE non-ttp, no evidence of DVT on exam.  Hemoglobin  Date Value Ref Range Status  12/26/2019 12.4 12.0 - 15.0 g/dL Final  06/09/2019 13.0 11.1 - 15.9 g/dL Final   HCT  Date Value Ref Range Status  12/26/2019 34.8 (L) 36 - 46 % Final   Hematocrit  Date Value Ref Range Status  06/09/2019 38.4 34.0 - 46.6 % Final     Disposition: stable, discharge to home. Baby Feeding: breastmilk and formula Baby Disposition: home with mom  Rh Immune globulin given: n/a Rubella vaccine given: s/p MMR x 2 Varicella vaccine given: s/p Varivax x 2 Tdap vaccine given in AP setting: 10/13/19 Flu vaccine given in AP setting: 06/2019  Contraception: considering Depo  Prenatal Labs:  Blood type/Rh  O pos  Antibody screen neg  Rubella  s/p vaccines x 2  Varicella  s/p vaccines x 2  RPR NR  HBsAg Neg  HIV NR  GC neg  Chlamydia neg  Genetic screening  Quad screen Pos OSB 1:7  1 hour GTT  72  GBS  negative     Plan:  CEthelwyn Gilbertsonwas discharged to home in good condition. Follow-up appointment with delivering provider in 4 weeks.  Discharge Medications: Allergies as of 12/26/2019   No Known Allergies     Medication List    TAKE these  medications   acetaminophen 325 MG tablet Commonly known as: Tylenol Take 2 tablets (650 mg total) by mouth every 4 (four) hours as needed (for pain scale < 4).   ibuprofen 600 MG tablet Commonly known as: ADVIL Take 1 tablet (600 mg total) by mouth every 6 (six) hours.   Prenatal Vitamins 28-0.8 MG Tabs Take 1 tablet by mouth daily.   senna-docusate 8.6-50 MG tablet Commonly known as: Senokot-S Take 2 tablets by mouth daily. Start taking  on: December 27, 2019        Follow-up Information    McVey, Murray Hodgkins, CNM Follow up in 4 week(s).   Specialty: Obstetrics and Gynecology Why: routine Postpartum visit Contact information: [place] [address] [phone]               Signed:  Francetta Found, CNM 12/26/2019  3:57 PM

## 2019-12-25 NOTE — Progress Notes (Signed)
Interpreter requested per MD. Request sent in.

## 2019-12-25 NOTE — Lactation Note (Signed)
This note was copied from a baby's chart. Lactation Consultation Note  Patient Name: Norma Becker [id number] Date: 12/25/2019 Reason for consult: Initial assessment;Term Kandis Cocking , spanish interpreter present  Maternal Data Formula Feeding for Exclusion: No Does the patient have breastfeeding experience prior to this delivery?: Yes Has a 24 yr old, breastfed x 1 yr, mom has no questions or concerns at present Feeding Feeding Type: Breast Fed Baby nursing when room entered, some swallowing noted LATCH Score Latch: Grasps breast easily, tongue down, lips flanged, rhythmical sucking.  Audible Swallowing: A few with stimulation  Type of Nipple: Everted at rest and after stimulation  Comfort (Breast/Nipple): Soft / non-tender  Hold (Positioning): No assistance needed to correctly position infant at breast.  LATCH Score: 9  Interventions Interventions: Breast feeding basics reviewed Kandis Cocking spanish interpreter present)  Lactation Tools Discussed/Used WIC Program: Yes [facility] name and no written on white board  Consult Status Consult Status: PRN    Dyann Kief 12/25/2019, 1:04 PM

## 2019-12-25 NOTE — Progress Notes (Signed)
CH visited with Pt and family in response to RN informing Ch in the morning that Pt wanted information on AD. Ch utilized interpreter services to communicate with Pt and family after giving spanish AD to Pt. Pt let Ch know that she did not ask for information on AD nor Healthcare PoA information. Ch apologized for the confusion, checked again at the nurses' station and left.

## 2019-12-26 LAB — MEASLES/MUMPS/RUBELLA IMMUNITY
Mumps IgG: 88.6 AU/mL (ref >10.9)
Rubella: 2.46 index (ref >0.99)
Rubeola IgG: 244 AU/mL (ref >16.4)

## 2019-12-26 LAB — CBC
HCT: 34.8 % — ABNORMAL LOW (ref 36.0–46.0)
Hemoglobin: 12.4 g/dL (ref 12.0–15.0)
MCH: 31.3 pg (ref 26.0–34.0)
MCHC: 35.6 g/dL (ref 30.0–36.0)
MCV: 87.9 fL (ref 80.0–100.0)
Platelets: 123 10*3/uL — ABNORMAL LOW (ref 150–400)
RBC: 3.96 MIL/uL (ref 3.87–5.11)
RDW: 14.1 % (ref 11.5–15.5)
WBC: 9.2 10*3/uL (ref 4.0–10.5)
nRBC: 0 % (ref 0.0–0.2)

## 2019-12-26 LAB — RPR: RPR Ser Ql: NONREACTIVE

## 2019-12-26 LAB — VARICELLA ZOSTER ANTIBODY, IGG: Varicella IgG: 1724 index (ref >165)

## 2019-12-26 MED ORDER — ACETAMINOPHEN 325 MG PO TABS: 650.0000 mg | ORAL_TABLET | ORAL | Status: AC | PRN

## 2019-12-26 MED ORDER — MEDROXYPROGESTERONE ACETATE 150 MG/ML IM SUSP
150.0000 mg | Freq: Once | INTRAMUSCULAR | Status: AC
  Administered 2019-12-26: 150 mg via INTRAMUSCULAR
  Filled 2019-12-26: qty 1

## 2019-12-26 MED ORDER — IBUPROFEN 600 MG PO TABS: 600.0000 mg | ORAL_TABLET | Freq: Four times a day (QID) | ORAL | 0 refills | Status: AC

## 2019-12-26 MED ORDER — SENNOSIDES-DOCUSATE SODIUM 8.6-50 MG PO TABS: 2.0000 | ORAL_TABLET | ORAL | Status: DC

## 2019-12-26 NOTE — Progress Notes (Signed)
Post Partum Day 1 Subjective: Doing well, no complaints.  Tolerating regular diet, pain with PO meds, voiding and ambulating without difficulty.  No CP SOB Fever,Chills, N/V or leg pain; denies nipple or breast pain, no HA change of vision, RUQ/epigastric pain  Objective: BP 98/77 (BP Location: Left Arm)   Pulse 85   Temp 97.9 F (36.6 C) (Oral)   Resp 18   Ht 5' (1.524 m)   Wt 60.3 kg   LMP 03/23/2019 (Exact Date) Comment: normal period  SpO2 99%   Breastfeeding Unknown   BMI 25.97 kg/m    Physical Exam:  General: NAD Breasts: soft/nontender CV: RRR Pulm: nl effort, CTABL Abdomen: soft, NT, BS x 4 Perineum: minimal edema, intact Lochia: scant Uterine Fundus: fundus firm and 2 fb below umbilicus DVT Evaluation: no cords, ttp LEs   Recent Labs    12/25/19 0647 12/26/19 0626  HGB 13.6 12.4  HCT 38.0 34.8*  WBC 12.2* 9.2  PLT 120* 123*    Assessment/Plan: 24 y.o. [id number] postpartum day # 1  - Continue routine PP care - Lactation consult prn  - infant with + coombs and completed bili-lights this am, may dc home later today if bilirubin is stable.  - Immunization status: all Imms up to date    Disposition: Does desire Dc home today if infant is deemed stable.     Randa Ngo, CNM 12/26/2019  9:45 AM

## 2019-12-26 NOTE — Plan of Care (Signed)
Vs stable; up ad lib; tolerating regular diet; taking motrin for pain control; breast and formula feeding; need interpreter

## 2019-12-26 NOTE — Discharge Instructions (Addendum)
Instrucciones de Systems developer Sangrado: Su sangrado puede continuar hasta 6 semanas, el flujo debe disminuir gradualmente y Estate agent debe oscurecerse y Engineer, mining aclararse durante las prximas dos semanas. Si nota que est sangrando mucho o expulsando cogulos ms grandes que el tamao de su puo, POR FAVOR llame a su mdico. No TAMPONES, DUCHA, ENEMAS O RELACIONES SEXUALES durante 6 semanas. Puntadas: Dchese todos los [address]  [address] con agua y un [place]. Los puntos se disolvern durante las [employer], si siente [place] en el rea vaginal, puede sentarse en agua tibia de 15 a 20 minutos, de 3 a 4 veces al da. Solo agua suficiente para cubrir el rea vaginal. AfterDolores: este es el tero que se contrae de [place] a su posicin y Paramedic. Use medicamentos recetados o recomendados por su mdico para ayudar a Emergency planning/management officer. Intestinos / Hemorroides: [place] mucha agua y [place]. Aumente la [place], las frutas y verduras frescas en su dieta. Descanso / Actividad: Descanse cuando el beb est descansando; No levante ms de 10 libras durante 6 semanas. No conduzca durante 1-2 semanas. Luanne Bras: Dchese [employer]! Dieta: Contine con las vitaminas prenatales y el hierro a diario hasta su visita de seguimiento para ayudar a reponer los nutrientes y las vitaminas. Si est amamantando, ingiera caloras adicionales y aumente la ingesta de lquidos a 12 vasos al Futures trader. Anticoncepcin: consulte con su proveedor sobre el mtodo anticonceptivo que le Civil engineer, contracting. Lactancia: Es posible que tenga una fiebre leve cuando le baje la [place], [place] debera desaparecer por s sola. Si no es as y se eleva por encima de 101,0, llame al mdico. Lizbeth Bark con bibern: use un sostn ajustado sin aros continuamente durante 3-5 das, evite cualquier estimulacin del pezn / mama. Si se produce congestin, tome ibuprofeno segn lo prescrito y aplique hojas frescas de col verde directamente en los senos  dentro de las copas del sostn. Posparto "BLUES": es comn a los [address] emocionales despus del parto, sin embargo, si persiste por ms de 2 semanas o si se siente preocupada por favor informe a su mdico inmediatamente. Esto es impulsado por las hormonas y nada que usted pueda Chief Operating Officer, as que hgale saber cmo se siente. Visita de seguimiento: programe una visita de seguimiento con su proveedor de parto  Llame al [employer] si tiene alguno de los siguientes sntomas: dolor de cabeza, cambios visuales, fiebre> 101.0 F, escalofros, problemas en los senos, sangrado vaginal excesivo, drenaje o problemas de incisin, dolor o enrojecimiento de las piernas, depresin o cualquier otra inquietud.  Si tiene alguna inquietud sobre su beb, llame a su pediatra Si tiene inquietudes sobre la [place], puede comunicarse con Microbiologist al [phone]

## 2019-12-26 NOTE — Progress Notes (Signed)
Reviewed D/C instructions with pt and family. Pt verbalized understanding of teaching. Discharged to home via W/C. Pt to schedule f/u appt.  

## 2019-12-29 ENCOUNTER — Ambulatory Visit: Payer: Self-pay

## 2020-03-16 ENCOUNTER — Encounter: Payer: Self-pay | Admitting: Advanced Practice Midwife

## 2020-03-16 ENCOUNTER — Ambulatory Visit (LOCAL_COMMUNITY_HEALTH_CENTER): Payer: Medicaid Other | Admitting: Advanced Practice Midwife

## 2020-03-16 ENCOUNTER — Other Ambulatory Visit: Payer: Self-pay

## 2020-03-16 VITALS — BP 108/68 | Ht 60.0 in | Wt 116.0 lb

## 2020-03-16 DIAGNOSIS — Z3042 Encounter for surveillance of injectable contraceptive: Secondary | ICD-10-CM | POA: Diagnosis not present

## 2020-03-16 DIAGNOSIS — Z3009 Encounter for other general counseling and advice on contraception: Secondary | ICD-10-CM

## 2020-03-16 LAB — HEMOGLOBIN, FINGERSTICK: Hemoglobin: 12.7 g/dL (ref 11.1–15.9)

## 2020-03-16 MED ORDER — MEDROXYPROGESTERONE ACETATE 150 MG/ML IM SUSP
150.0000 mg | INTRAMUSCULAR | Status: AC
  Administered 2020-03-16 – 2020-11-02 (×4): 150 mg via INTRAMUSCULAR

## 2020-03-16 NOTE — Progress Notes (Signed)
Contraception/Family Planning VISIT ENCOUNTER NOTE  Subjective:   Makaya Juneau is a 24 y.o. [id number] SHF nonsmoker female here for reproductive life counseling. SVD 12/25/19 at 39.0 wks F 7#13 with nuchal cord.  Nursing q 2 hours and baby weighed 13 lbs on 02/24/20.  FOB and daughter helping with baby.  LMP 02/20/20.  Last sex 03/12/20; with current partner x 6 years; 1 sex partner last 3 mo.  Last pap 04/21/18. Last DMPA given after birth in hospital on 12/26/19.  Had pp exam at [facility] on 01/20/20 and wants DMPA.  Desires DMPA for BCM.  Reports she does not want a pregnancy in the next year. Denies abnormal vaginal bleeding, discharge, pelvic pain, problems with intercourse or other gynecologic concerns.    Gynecologic History Patient's last menstrual period was 02/20/2020 (exact date). Contraception: Depo-Provera injections  Health Maintenance Due  Topic Date Due  . Hepatitis C Screening  Never done  . COVID-19 Vaccine (1) Never done  . CHLAMYDIA SCREENING  Never done  . INFLUENZA VACCINE  02/07/2020     The following portions of the patient's history were reviewed and updated as appropriate: allergies, current medications, past family history, past medical history, past social history, past surgical history and problem list.  Review of Systems Pertinent items are noted in HPI.  Reviewed ROS, family history from health survey form and discussed with pt   Objective:  BP 108/68   Ht 5' (1.524 m)   Wt 116 lb (52.6 kg)   LMP 02/20/2020 (Exact Date)   Breastfeeding Yes   BMI 22.65 kg/m  Gen: well appearing, NAD HEENT: no scleral icterus CV: RR Lung: Normal WOB    Assessment and Plan:   Contraception counseling: Reviewed all forms of birth control options in the tiered based approach. available including abstinence; over the counter/barrier methods; hormonal contraceptive medication including pill, patch, ring, injection,contraceptive implant, ECP; hormonal and nonhormonal IUDs;  permanent sterilization options including vasectomy and the various tubal sterilization modalities. Risks, benefits, and typical effectiveness rates were reviewed.  Questions were answered.  Written information was also given to the patient to review.  Patient desires DMPA, this was prescribed for patient. She will follow up in 11-13 wks for surveillance.  She was told to call with any further questions, or with any concerns about this method of contraception.  Emphasized use of condoms 100% of the time for STI prevention.  Patient was offered ECP. ECP was not accepted by the patient. ECP counseling was not given - see RN documentation  1. Family planning PP exam done at [facility] 01/20/20`.  Pt breastfeeding infant - Hemoglobin, venipuncture  2. Encounter for surveillance of injectable contraceptive DMPA 150 mg IM q 11-13 wks x 1 year (first DMPA given while in hospital on 12/26/19 after birth)    Please refer to After Visit Summary for other counseling recommendations.   Return for 11-13 wk DMPA.  Alberteen Spindle, CNM [facility] DEPARTMENT

## 2020-03-16 NOTE — Progress Notes (Signed)
Hgb 12.7. Depo given per provider order. Next due around 06/01/2020. Tawny Hopping, RN

## 2020-03-16 NOTE — Progress Notes (Signed)
Here today for PP appt and Depo. Patient delivered vaginally 12/25/2019 a viable female @ [facility]. Patient had a PP appt at [facility] 01/20/2020 but had received Depo at hospital discharge 12/26/2019 (11.4 weeks.) Last Pap Smear was 04/21/2018 with next being due per Epic 04/21/2021. Patient declines all STD screening today. Tawny Hopping, RN

## 2020-06-01 ENCOUNTER — Ambulatory Visit (LOCAL_COMMUNITY_HEALTH_CENTER): Payer: Self-pay

## 2020-06-01 ENCOUNTER — Other Ambulatory Visit: Payer: Self-pay

## 2020-06-01 VITALS — BP 106/69 | Ht 60.0 in | Wt 119.0 lb

## 2020-06-01 DIAGNOSIS — Z3009 Encounter for other general counseling and advice on contraception: Secondary | ICD-10-CM

## 2020-06-01 DIAGNOSIS — Z3042 Encounter for surveillance of injectable contraceptive: Secondary | ICD-10-CM

## 2020-06-01 NOTE — Progress Notes (Signed)
Depo given per E. Sciora CNM; tolerated well Richmond Campbell, RN

## 2020-08-17 ENCOUNTER — Other Ambulatory Visit: Payer: Self-pay

## 2020-08-17 ENCOUNTER — Ambulatory Visit (LOCAL_COMMUNITY_HEALTH_CENTER): Payer: Self-pay

## 2020-08-17 VITALS — BP 110/70 | Ht 60.0 in | Wt 117.5 lb

## 2020-08-17 DIAGNOSIS — Z3009 Encounter for other general counseling and advice on contraception: Secondary | ICD-10-CM

## 2020-08-17 DIAGNOSIS — Z3042 Encounter for surveillance of injectable contraceptive: Secondary | ICD-10-CM

## 2020-08-17 NOTE — Progress Notes (Signed)
11 weeks 0 days post depo. Voices no concerns. Depo given today (Left Deltoid) per order by Hazle Coca, CNM dated 03/16/2020. Tolerated well. Next depo due 11/02/2020, pt aware. Lang line, interpeter. Jerel Shepherd, RN

## 2020-11-02 ENCOUNTER — Other Ambulatory Visit: Payer: Self-pay

## 2020-11-02 ENCOUNTER — Ambulatory Visit (LOCAL_COMMUNITY_HEALTH_CENTER): Payer: Self-pay

## 2020-11-02 VITALS — BP 110/74 | Ht 60.0 in | Wt 120.5 lb

## 2020-11-02 DIAGNOSIS — Z3009 Encounter for other general counseling and advice on contraception: Secondary | ICD-10-CM

## 2020-11-02 DIAGNOSIS — Z3042 Encounter for surveillance of injectable contraceptive: Secondary | ICD-10-CM

## 2020-11-02 NOTE — Progress Notes (Signed)
11 weeks post depo. Voices no concerns. Depo given today per order by Hazle Coca, CNM dated 03/16/2020. Tolerated well R deltoid. Next depo due 01/18/2021, pt aware. M. [place], interpreter. Jerel Shepherd, RN

## 2020-12-12 IMAGING — US US OB < 14 WEEKS - US OB TV
1 series · 14 of 28 positions shown · non-contrast
Comparison: None.

CLINICAL DATA: Lower abdominal pain for 1 day

EXAM:
OBSTETRIC <14 WK US AND TRANSVAGINAL OB US
TECHNIQUE: Both transabdominal and transvaginal ultrasound examinations were
performed for complete evaluation of the gestation as well as the
maternal uterus, adnexal regions, and pelvic cul-de-sac.
Transvaginal technique was performed to assess early pregnancy.

[Series 1: us ob < 14 weeks - us ob tv · 0.15mm/px · 118 acquisitions, 14 frames shown]
[im 5/118]
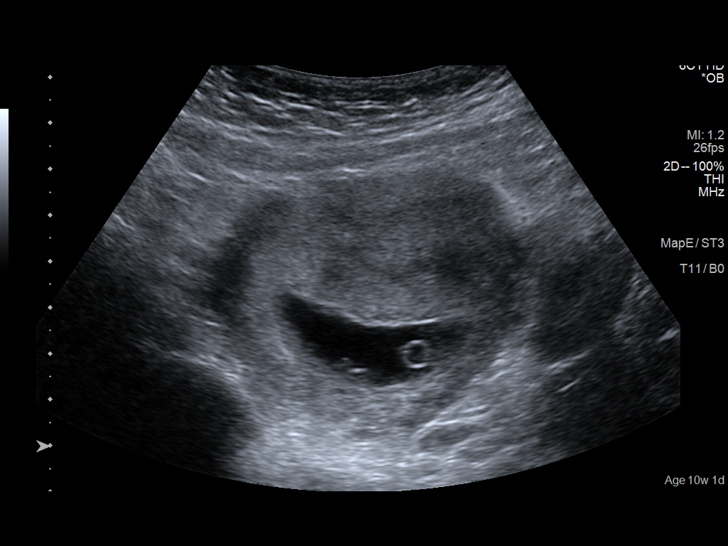
[im 14/118]
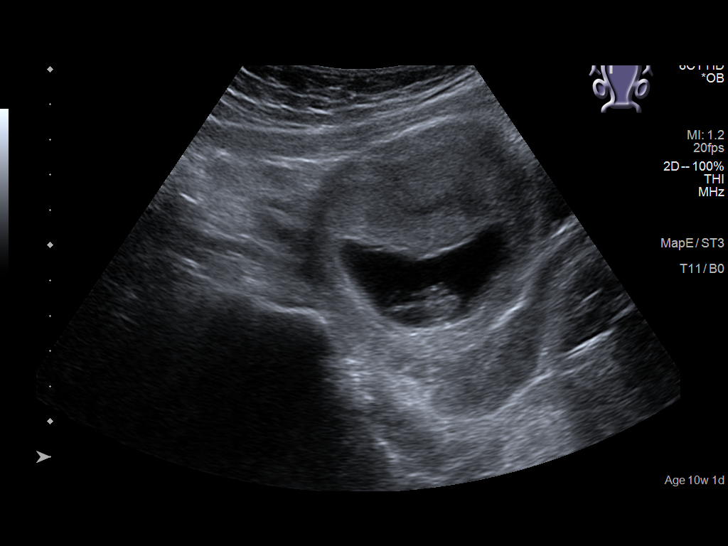
[im 22/118]
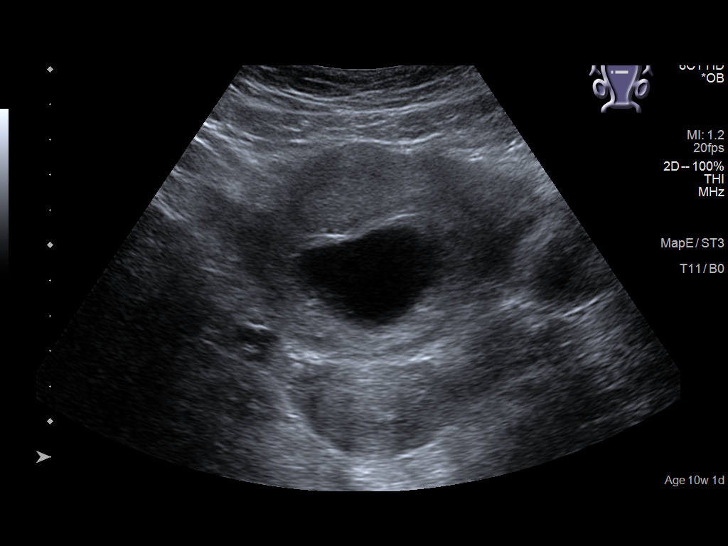
[im 31/118]
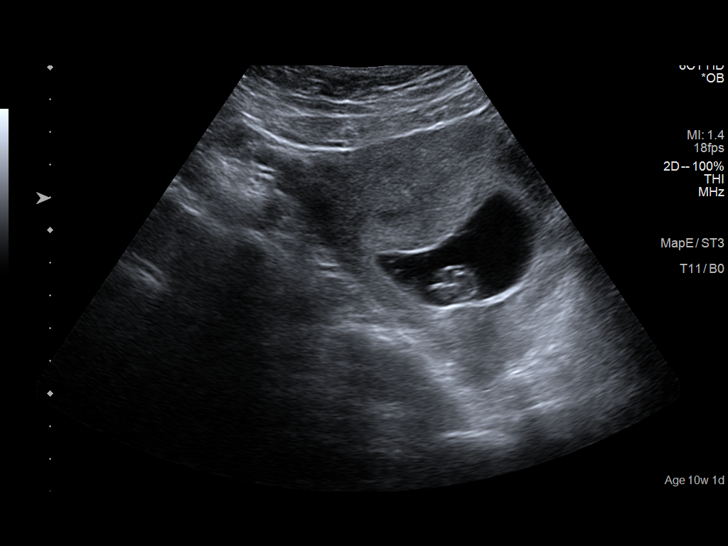
[im 40/118]
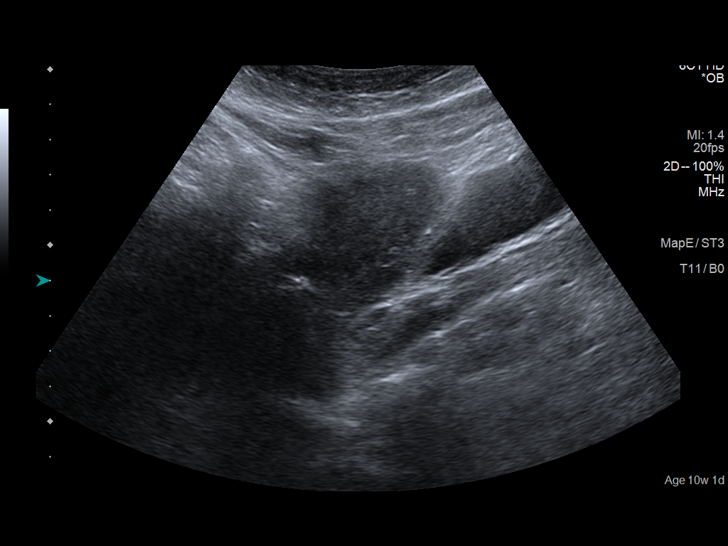
[im 48/118]
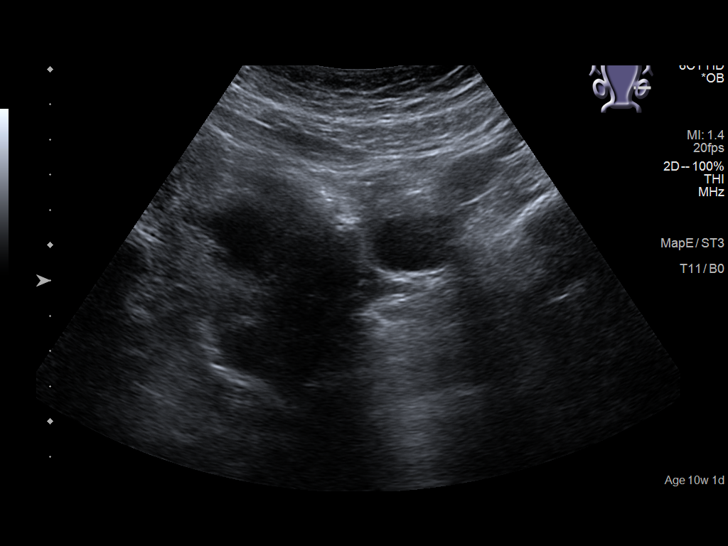
[im 57/118]
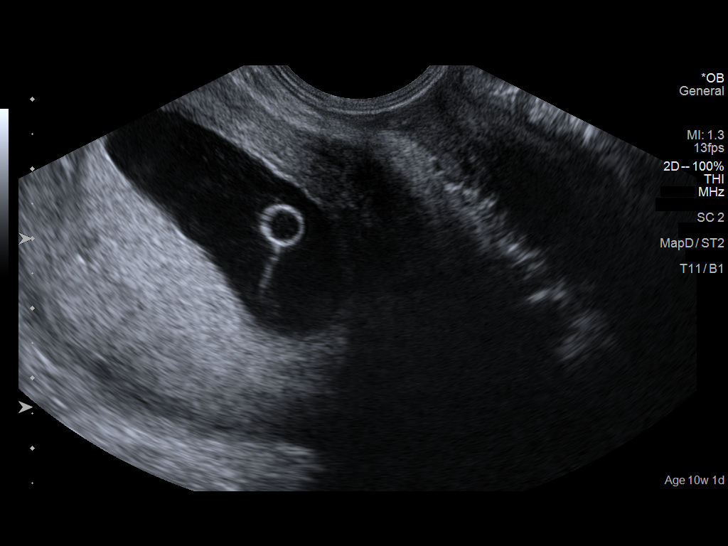
[im 66/118]
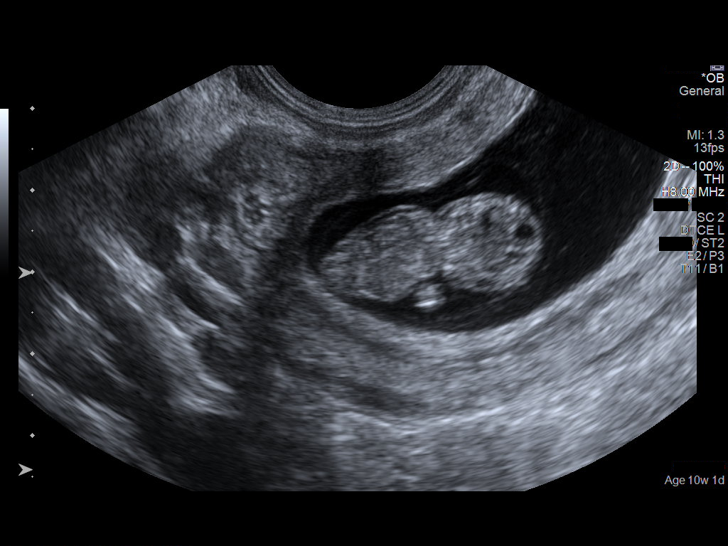
[im 74/118]
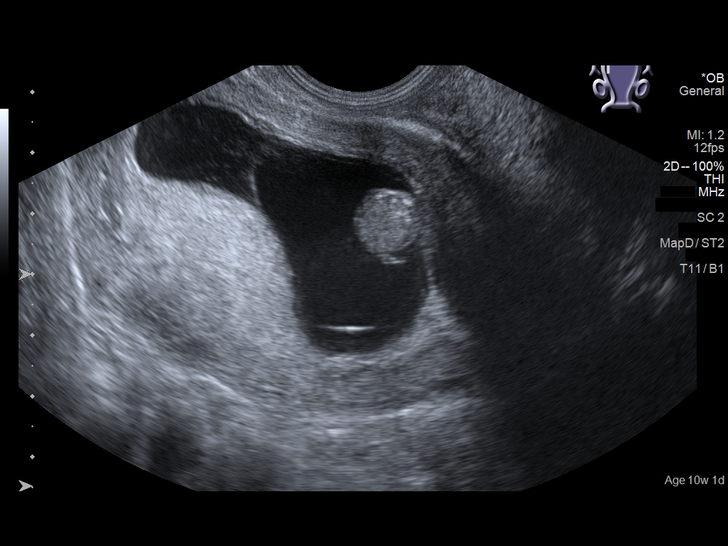
[im 83/118]
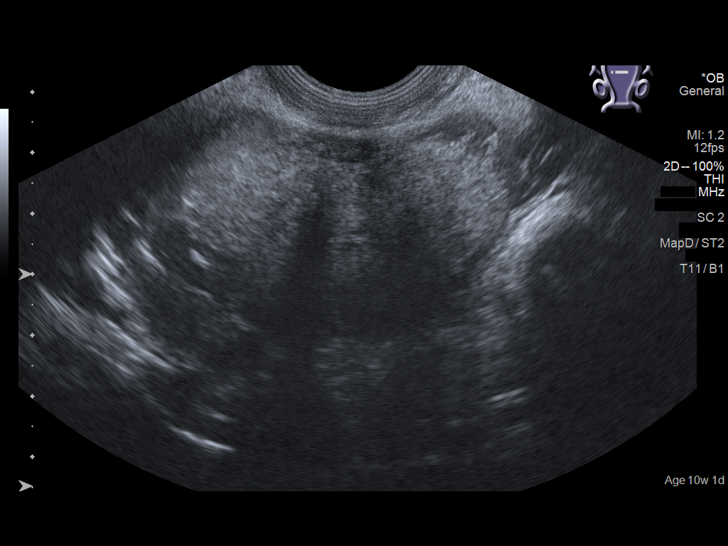
[im 92/118]
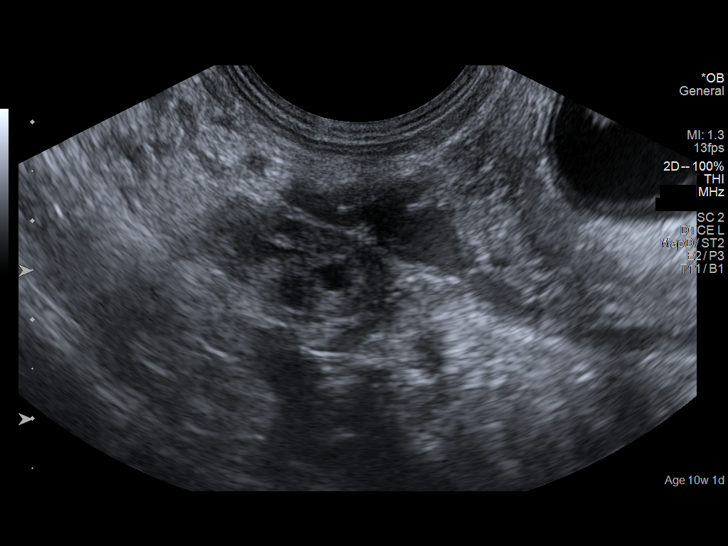
[im 100/118]
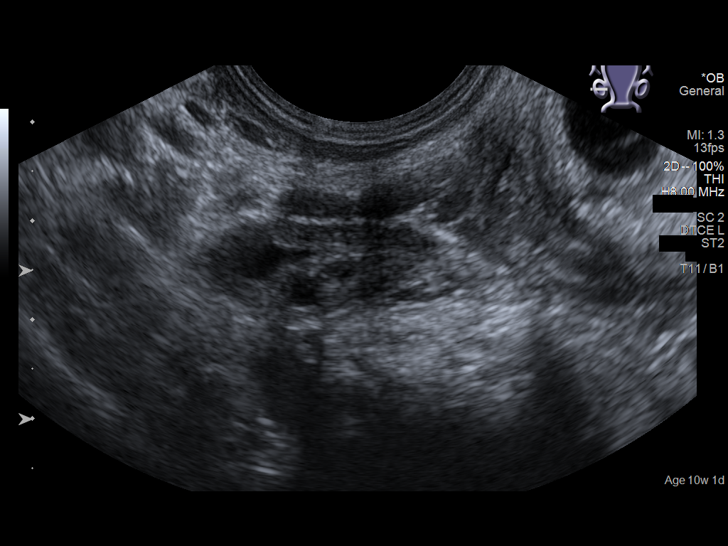
[im 109/118]
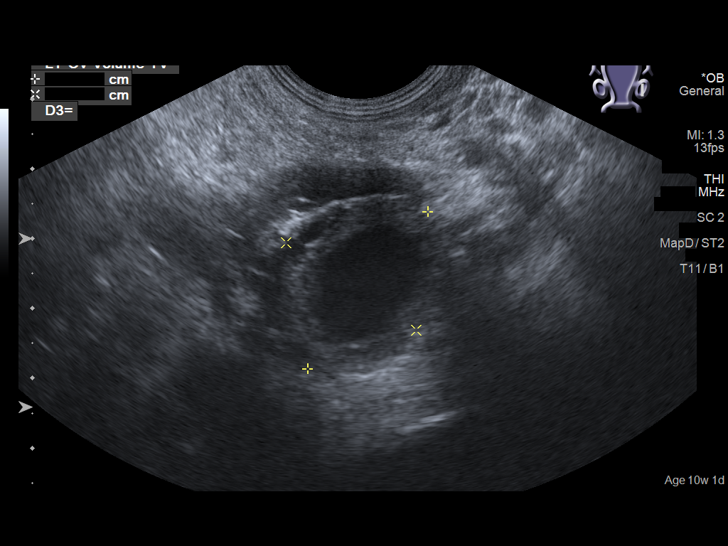
[im 118/118]
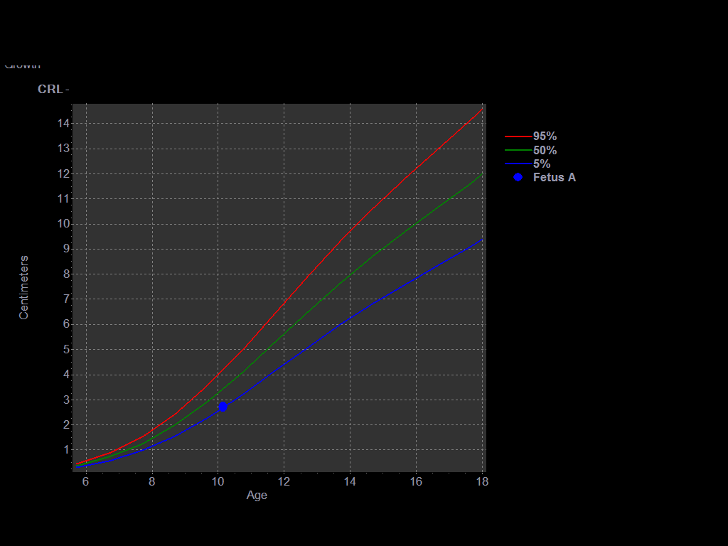

[14 of 28 positions shown; findings below may reference images not displayed]

FINDINGS: LMP: 03/23/2019

GA by LMP: 10 w  1 d

EDC by LMP: 12/28/2019

Intrauterine gestational sac: Single

Yolk sac:  Visualized.

Embryo:  Visualized.

Cardiac Activity: Visualized.

Heart Rate: 169 bpm

CRL:  27.2 mm   9 w   4 d                  US EDC: 01/01/2020

Subchorionic hemorrhage:  None visualized.

Maternal uterus/adnexae: Probable corpus luteum seen in the left
ovary measuring up to 1.9 cm. Ovaries and adnexal structures are
otherwise unremarkable. Maternal uterus is free of acute
abnormality. No free fluid
IMPRESSION: Single intrauterine gestation at 9 weeks 4 days estimated
gestational age by crown-rump length measurement.

## 2021-01-19 ENCOUNTER — Ambulatory Visit (LOCAL_COMMUNITY_HEALTH_CENTER): Payer: Self-pay

## 2021-01-19 ENCOUNTER — Other Ambulatory Visit: Payer: Self-pay

## 2021-01-19 VITALS — BP 123/79 | Ht 60.0 in | Wt 117.5 lb

## 2021-01-19 DIAGNOSIS — Z3009 Encounter for other general counseling and advice on contraception: Secondary | ICD-10-CM

## 2021-01-19 DIAGNOSIS — Z3042 Encounter for surveillance of injectable contraceptive: Secondary | ICD-10-CM

## 2021-01-19 MED ORDER — MEDROXYPROGESTERONE ACETATE 150 MG/ML IM SUSP
150.0000 mg | Freq: Once | INTRAMUSCULAR | Status: AC
  Administered 2021-01-19: 150 mg via INTRAMUSCULAR

## 2021-01-19 NOTE — Progress Notes (Signed)
11 weeks 1 day post depo. Voices no concerns. Depo given today per order by Hazle Coca, CNM dated 03/16/2020. Tolerated well L Delt. Physical due at next depo, approx 04/06/2021. Pt has reminder. Jerel Shepherd, RN

## 2021-04-04 ENCOUNTER — Other Ambulatory Visit: Payer: Self-pay

## 2021-04-04 ENCOUNTER — Ambulatory Visit (LOCAL_COMMUNITY_HEALTH_CENTER): Payer: Self-pay | Admitting: Family Medicine

## 2021-04-04 VITALS — BP 108/72 | HR 79 | Temp 97.2°F | Ht 60.0 in | Wt 120.4 lb

## 2021-04-04 DIAGNOSIS — Z113 Encounter for screening for infections with a predominantly sexual mode of transmission: Secondary | ICD-10-CM

## 2021-04-04 DIAGNOSIS — Z30013 Encounter for initial prescription of injectable contraceptive: Secondary | ICD-10-CM

## 2021-04-04 DIAGNOSIS — Z3009 Encounter for other general counseling and advice on contraception: Secondary | ICD-10-CM

## 2021-04-04 DIAGNOSIS — Z3042 Encounter for surveillance of injectable contraceptive: Secondary | ICD-10-CM

## 2021-04-04 DIAGNOSIS — Z01419 Encounter for gynecological examination (general) (routine) without abnormal findings: Secondary | ICD-10-CM

## 2021-04-04 LAB — WET PREP FOR TRICH, YEAST, CLUE
Clue Cell Exam: NEGATIVE
Trichomonas Exam: NEGATIVE
Yeast Exam: NEGATIVE

## 2021-04-04 MED ORDER — MEDROXYPROGESTERONE ACETATE 150 MG/ML IM SUSP
150.0000 mg | INTRAMUSCULAR | Status: AC
  Administered 2021-04-04 – 2021-09-25 (×3): 150 mg via INTRAMUSCULAR

## 2021-04-04 NOTE — Progress Notes (Signed)
Family Planning Visit- Repeat Yearly Visit  Subjective:  Norma Becker is a 25 y.o. [phone]  being seen today for an annual wellness visit and to discuss contraception options.   The patient is currently using Depo Provera for pregnancy prevention. Patient does not want a pregnancy in the next year. Patient has the following medical problems: has Underweight BMI=18.0 on their problem list.  Chief Complaint  Patient presents with   Annual Exam    Patient reports here for physical and Depo.    Patient denies any problems or concerns and declines bloodwork today.     See flowsheet for other program required questions.   Body mass index is 23.51 kg/m. - Patient is eligible for diabetes screening based on BMI and age >62?  no HA1C ordered? no  Patient reports 1 of partners in last year. Desires STI screening?  Yes, declined HIV and RPR today.     Has patient been screened once for HCV in the past?  No  No results found for: HCVAB  Does the patient have current of drug use, have a partner with drug use, and/or has been incarcerated since last result? No  If yes-- Screen for HCV through [facility]   Does the patient meet criteria for HBV testing? No  Criteria:  -Household, sexual or needle sharing contact with HBV -History of drug use -HIV positive -Those with known Hep C   Health Maintenance Due  Topic Date Due   COVID-19 Vaccine (1) Never done   HPV VACCINES (1 - 2-dose series) Never done   Hepatitis C Screening  Never done   CHLAMYDIA SCREENING  12/07/2020   INFLUENZA VACCINE  02/06/2021   PAP-Cervical Cytology Screening  04/21/2021   PAP SMEAR-Modifier  04/21/2021    Review of Systems  Constitutional:  Negative for chills, fever, malaise/fatigue and weight loss.  HENT:  Negative for congestion, hearing loss and sore throat.   Eyes:  Negative for blurred vision, double vision and photophobia.  Respiratory:  Negative for shortness of breath.    Cardiovascular:  Negative for chest pain.  Gastrointestinal:  Negative for abdominal pain, blood in stool, constipation, diarrhea, heartburn, nausea and vomiting.  Genitourinary:  Negative for dysuria and frequency.  Musculoskeletal:  Negative for back pain, joint pain and neck pain.  Skin:  Negative for itching and rash.  Neurological:  Negative for dizziness, weakness and headaches.  Endo/Heme/Allergies:  Does not bruise/bleed easily.  Psychiatric/Behavioral:  Negative for depression, substance abuse and suicidal ideas.    The following portions of the patient's history were reviewed and updated as appropriate: allergies, current medications, past family history, past medical history, past social history, past surgical history and problem list. Problem list updated.  Objective:   Vitals:   04/04/21 1100  BP: 108/72  Pulse: 79  Temp: (!) 97.2 F (36.2 C)  Weight: 120 lb 6.4 oz (54.6 kg)  Height: 5' (1.524 m)    Physical Exam Vitals and nursing note reviewed.  Constitutional:      Appearance: Normal appearance.  HENT:     Head: Normocephalic and atraumatic.     Mouth/Throat:     Mouth: Mucous membranes are moist.     Dentition: Normal dentition. No dental caries.     Pharynx: No oropharyngeal exudate or posterior oropharyngeal erythema.  Eyes:     General: No scleral icterus. Neck:     Thyroid: No thyroid mass, thyromegaly or thyroid tenderness.  Cardiovascular:     Rate and  Rhythm: Normal rate and regular rhythm.     Pulses: Normal pulses.     Heart sounds: Normal heart sounds.  Pulmonary:     Effort: Pulmonary effort is normal.     Breath sounds: Normal breath sounds.  Abdominal:     General: Abdomen is flat. Bowel sounds are normal.     Palpations: Abdomen is soft.  Genitourinary:    General: Normal vulva.     Rectum: Normal.     Comments: External genitalia without, lice, nits, erythema, edema , lesions or inguinal adenopathy. Vagina with normal mucosa and  discharge and pH equals 4.  Cervix without visual lesions, uterus firm, mobile, non-tender, no masses, CMT adnexal fullness or tenderness.   Musculoskeletal:        General: Normal range of motion.     Cervical back: Normal range of motion and neck supple.  Skin:    General: Skin is warm and dry.  Neurological:     General: No focal deficit present.     Mental Status: She is alert and oriented to person, place, and time.  Psychiatric:        Mood and Affect: Mood normal.        Behavior: Behavior normal.      Assessment and Plan:  Norma Becker is a 25 y.o. female [phone] presenting to the [facility] Department for an yearly wellness and contraception visit   1. Smear, vaginal, as part of routine gynecological examination  -well woman exam -pap today  -CBE today  - IGP, Aptima HPV  2. Screening examination for venereal disease  - Chlamydia/Gonorrhea [facility] - WET PREP FOR TRICH, YEAST, CLUE  3. Family Planning Counseling   Contraception counseling: Reviewed all forms of birth control options in the tiered based approach. available including abstinence; over the counter/barrier methods; hormonal contraceptive medication including pill, patch, ring, injection,contraceptive implant, ECP; hormonal and nonhormonal IUDs; permanent sterilization options including vasectomy and the various tubal sterilization modalities. Risks, benefits, and typical effectiveness rates were reviewed.  Questions were answered.  Written information was also given to the patient to review.  Patient desires depo, this was prescribed for patient. She will follow up in  11-13 weeks  for surveillance.  She was told to call with any further questions, or with any concerns about this method of contraception.  Emphasized use of condoms 100% of the time for STI prevention.  Patient was not offered ECP based on current BCM.   4. Encounter for surveillance of injectable contraceptive Last  depo 01/19/21, ok to give Depo today at [redacted]w[redacted]d.  Discussed importance of keeping depo at 11-13 weeks.   RN to administer Depo today.  - medroxyPROGESTERone (DEPO-PROVERA) injection 150 mg  Return in about 11 weeks (around 06/20/2021) for depo.  No future appointments.  Norma Becker  used for Spanish interpretation.      Wendi Snipes, FNP

## 2021-04-04 NOTE — Progress Notes (Signed)
Presents for annual exam and Depo. Last Depo 01/19/2021 and now 10 weeks and 5 days post last injection. Elveria Rising FNP-BC aware. Jossie Ng, RN Wet mount reviewed - negative. Depo administered per Elveria Rising FNP-BC order and client tolerated with minimal complaint. Jossie Ng, RN

## 2021-04-10 LAB — IGP, APTIMA HPV
HPV Aptima: NEGATIVE
PAP Smear Comment: 0

## 2021-04-12 ENCOUNTER — Ambulatory Visit: Payer: Self-pay

## 2021-06-07 IMAGING — US US MFM OB FOLLOW-UP
1 of 2 series · 13 of 28 positions shown · non-contrast
Comparison: none

PATIENT INFO:

             EILAND
PERFORMED BY:
                   HAMILTON CNM
SERVICE(S) PROVIDED:
 ----------------------------------------------------------------------
INDICATIONS:
  34 weeks gestation of pregnancy
FETAL EVALUATION:
 Num Of Fetuses:         1
 Fetal Heart Rate(bpm):  147
 Cardiac Activity:       Present
 Presentation:           Cephalic
 Placenta:               Posterior Grade X, No previa
 AFI Sum(cm)     %Tile       Largest Pocket(cm)
 14.32           51
 RUQ(cm)       RLQ(cm)       LUQ(cm)        LLQ(cm)
BIOMETRY:
 BPD:      86.9  mm     G. Age:  35w 0d         58  %    CI:        72.16   %    70 - 86
                                                         FL/HC:      20.3   %    20.1 -
 HC:      325.5  mm     G. Age:  36w 6d         67  %    HC/AC:      1.05        0.93 -
 AC:      308.7  mm     G. Age:  34w 5d         55  %    FL/BPD:     76.1   %    71 - 87
 FL:       66.1  mm     G. Age:  34w 0d         22  %    FL/AC:      21.4   %    20 - 24
 HUM:      58.7  mm     G. Age:  34w 0d         48  %
 Est. FW:    1111  gm      5 lb 9 oz     47  %
GESTATIONAL AGE:
 LMP:           34w 6d        Date:  03/27/19                 EDD:   01/01/20
 Clinical EDD:  34w 6d                                        EDD:   01/01/20
 U/S Today:     35w 1d                                        EDD:   12/30/19
 Best:          34w 6d     Det. By:  LMP  (03/27/19)          EDD:   01/01/20
ANATOMY:
 Ventricles:            Within Normal Limits   Stomach:                Seen
 Heart:                 4-Chamber view         Kidneys:                Within Normal Limits
                        appears normal
 RVOT:                  Within Normal Limits   Bladder:                Seen
 LVOT:                  Within Normal Limits   Spine:                  Within Normal Limits

[Series 1: us mfm ob follow-up · 0.28mm/px · 13 of 41 slices shown]
[im 2/41]
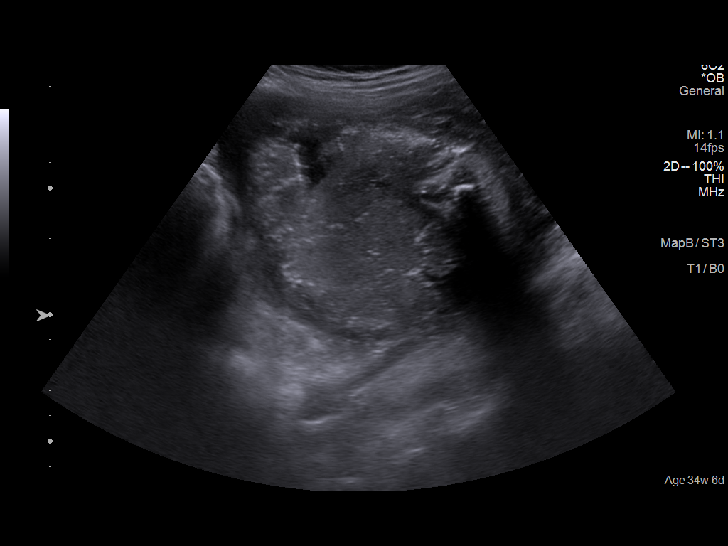
[im 5/41]
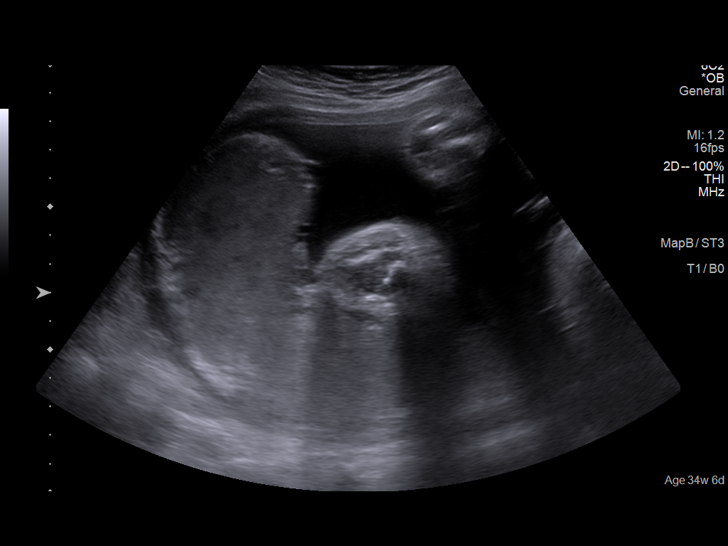
[im 8/41]
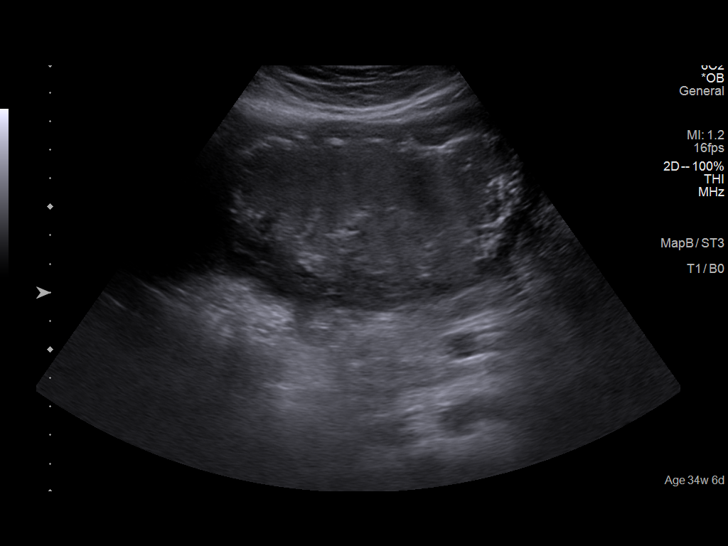
[im 11/41]
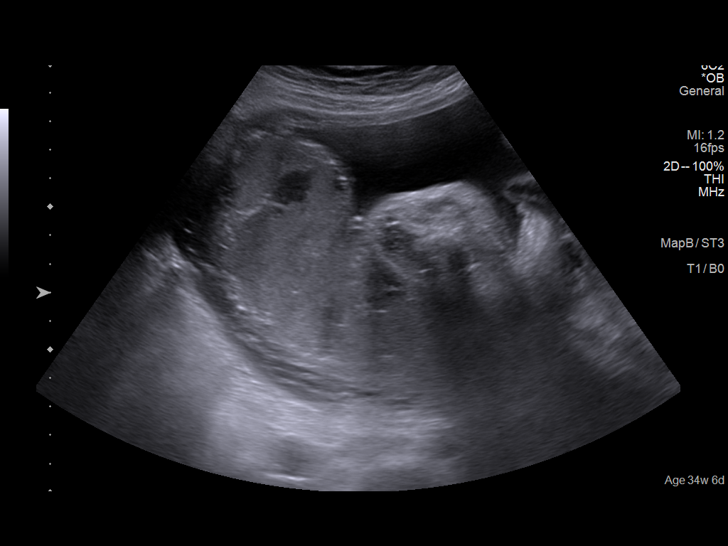
[im 14/41]
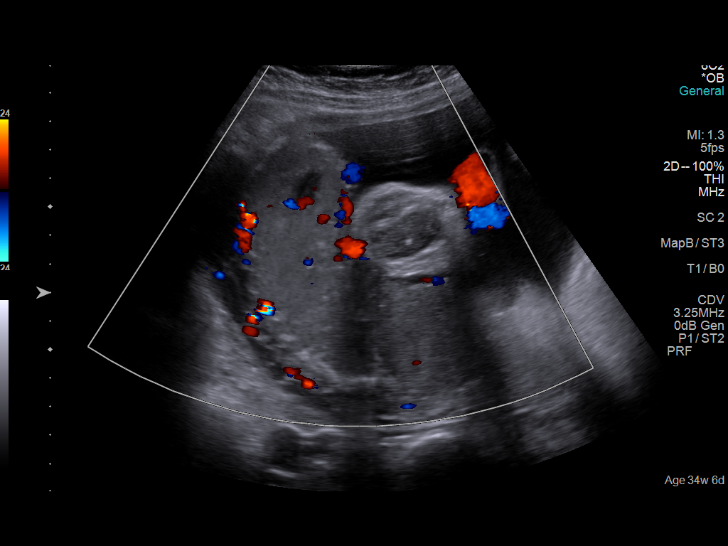
[im 17/41]
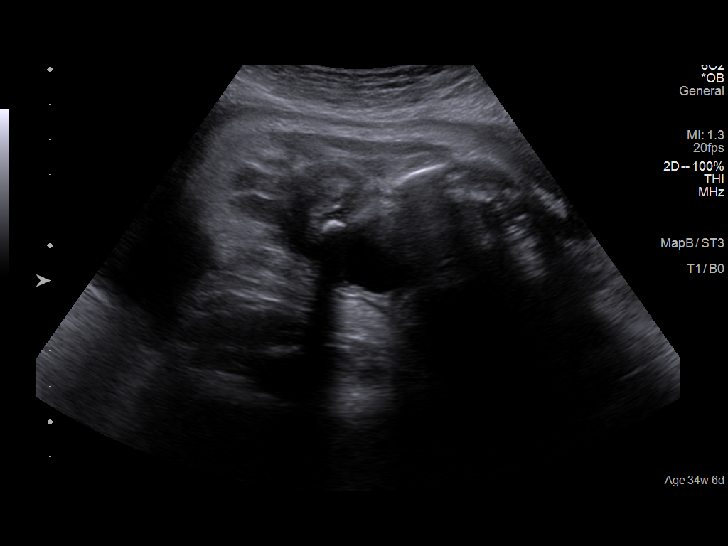
[im 22/41]
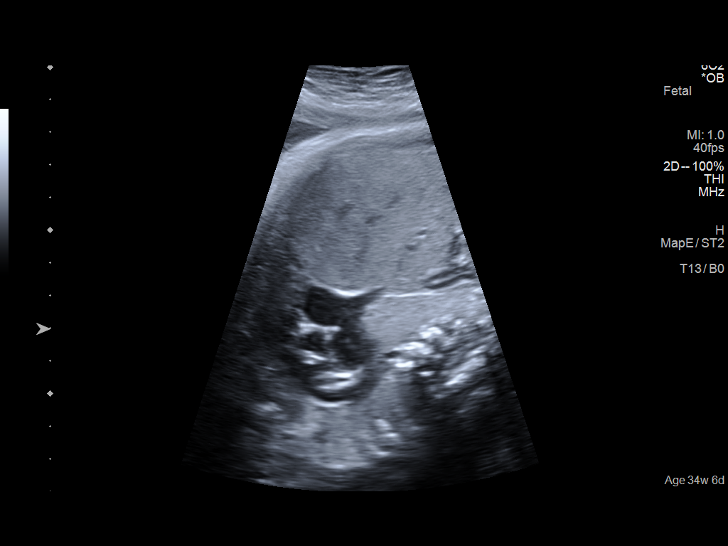
[im 25/41]
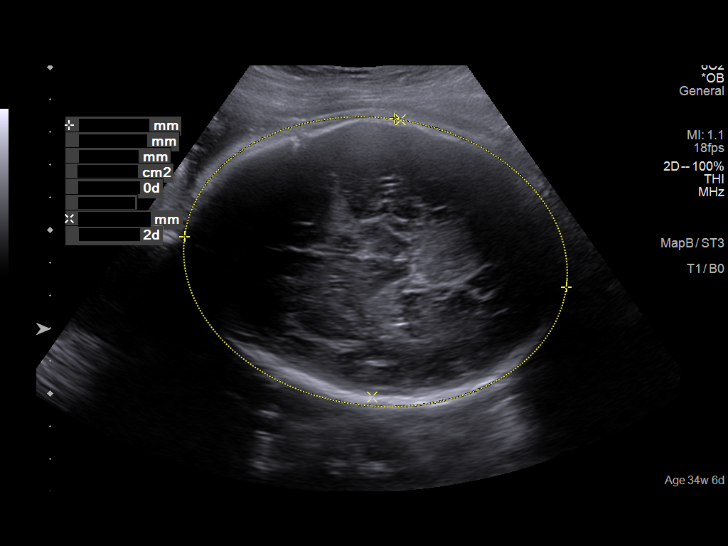
[im 28/41]
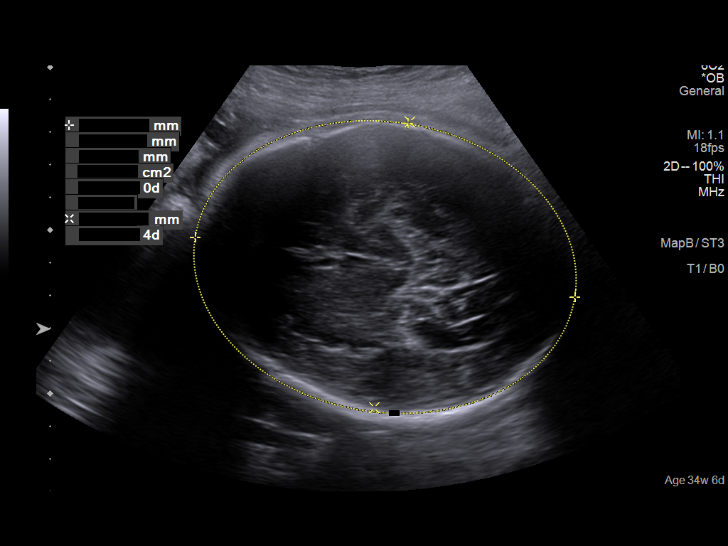
[im 31/41]
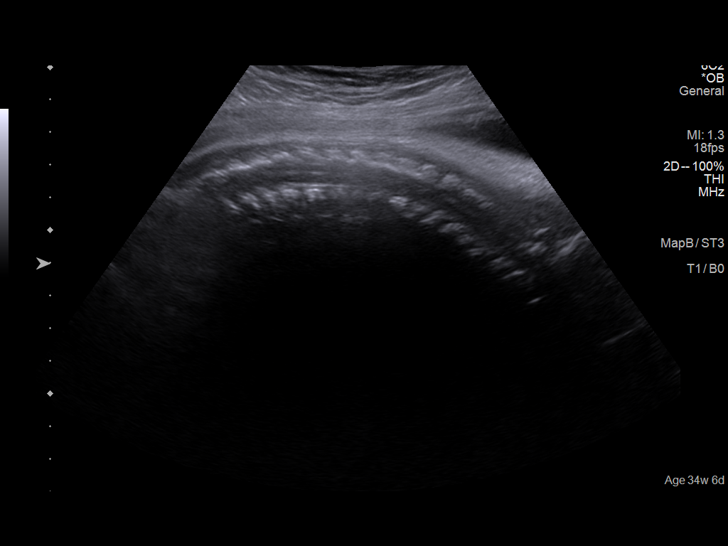
[im 34/41]
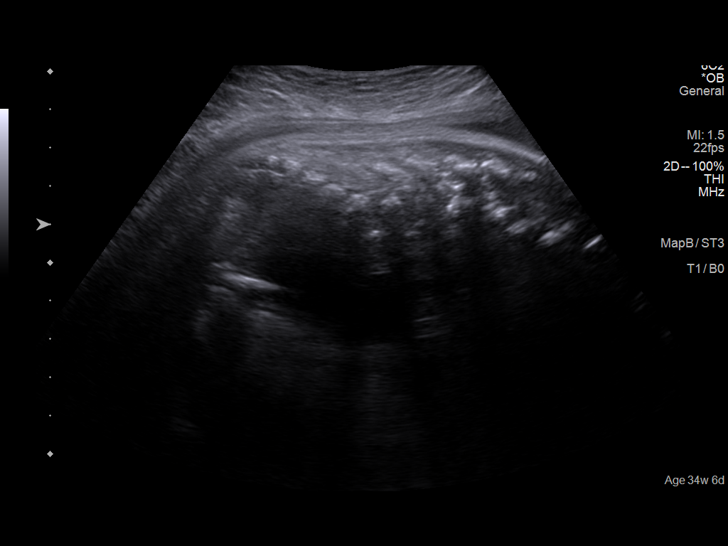
[im 37/41]
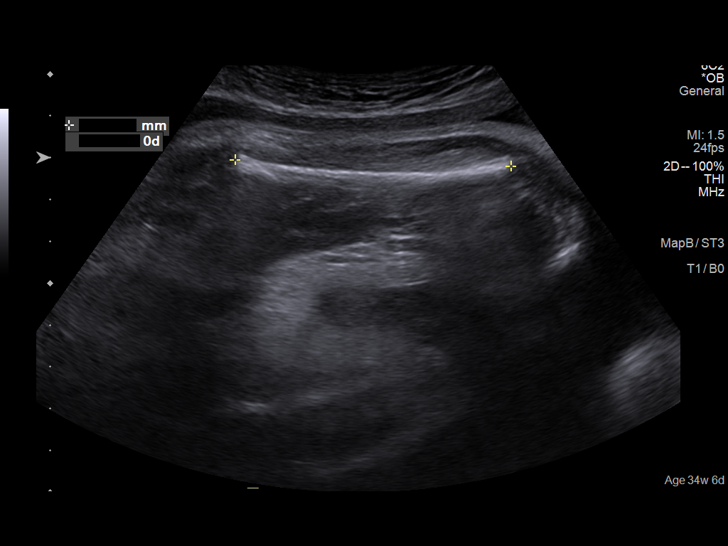
[im 41/41]
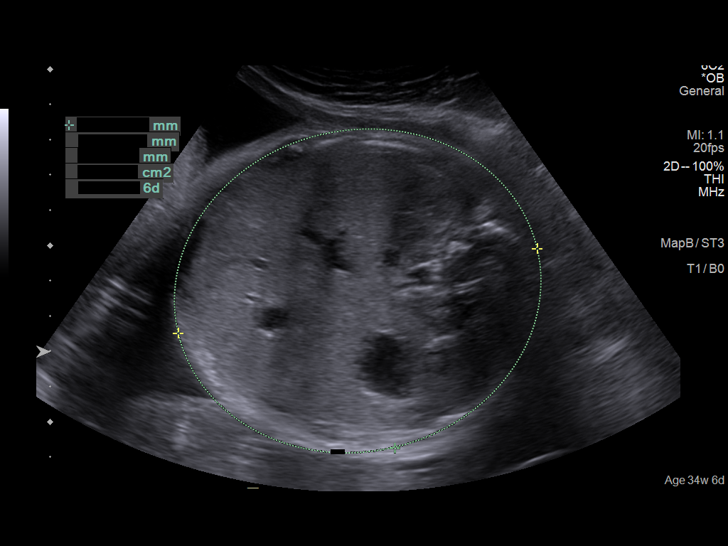

[13 of 28 positions shown; findings below may reference images not displayed]

IMPRESSION: Thank you for referring your patient for a fetal growth
 evaluation due to an elevated MSAFP of 3.0 MOM and
 presence of a fluid colleciton on the fetal surface of the
 placenta .
 On follow up scanning the the fluid collection was smaller in
 size and on the last ultrasound, was resolved. Visit completed
 with in person Spanish interpreter.

 She is at 34w 6d.
 Dating is by LMP consistent with earliest scan on 06/02/19 at
 9 w 4d.

 There is a singleton gestation with normal amniotic fluid
 volume.

 The fetal biometry correlates with established dating.
 Adequate interval growth noted.
 The estimated fetal weight is at the 47th
    percentile. PAT.
 Previously noted fluid collection on the fetal surface of the
 placenta is no longer seen

 Thank you for allowing us to participate in your patient's care.
 assistance.

                  Ing Ming, Felix Aink

## 2021-06-29 ENCOUNTER — Ambulatory Visit (LOCAL_COMMUNITY_HEALTH_CENTER): Payer: Self-pay

## 2021-06-29 ENCOUNTER — Other Ambulatory Visit: Payer: Self-pay

## 2021-06-29 VITALS — BP 100/63 | Ht 60.0 in | Wt 120.5 lb

## 2021-06-29 DIAGNOSIS — Z3009 Encounter for other general counseling and advice on contraception: Secondary | ICD-10-CM

## 2021-06-29 DIAGNOSIS — Z3042 Encounter for surveillance of injectable contraceptive: Secondary | ICD-10-CM

## 2021-06-29 NOTE — Progress Notes (Signed)
12 weeks 2 days post depo. Voices no concerns. Depo given today per order by Elveria Rising, FNP dated 04/04/2021. Tolerated well L delt. Next depo due 09/14/2021, pt aware. M Yemen, interpreter. Jerel Shepherd, RN

## 2021-09-25 ENCOUNTER — Ambulatory Visit (LOCAL_COMMUNITY_HEALTH_CENTER): Payer: Self-pay

## 2021-09-25 ENCOUNTER — Other Ambulatory Visit: Payer: Self-pay

## 2021-09-25 VITALS — BP 107/69 | Ht 60.0 in | Wt 122.0 lb

## 2021-09-25 DIAGNOSIS — Z30013 Encounter for initial prescription of injectable contraceptive: Secondary | ICD-10-CM

## 2021-09-25 DIAGNOSIS — Z3009 Encounter for other general counseling and advice on contraception: Secondary | ICD-10-CM

## 2021-09-25 NOTE — Progress Notes (Signed)
12 weeks 4 days post depo injection. States very little bleeding usually one day every 2 months.  Depo given IM left deltoid per order by Elveria Rising NP dated 04/04/21. Pt. tolerated well.  Next depo due 12/11/21. Cherlynn Polo, RN  ?

## 2021-12-13 ENCOUNTER — Ambulatory Visit (LOCAL_COMMUNITY_HEALTH_CENTER): Payer: Self-pay

## 2021-12-13 VITALS — BP 108/65 | Ht 60.0 in | Wt 123.5 lb

## 2021-12-13 DIAGNOSIS — Z3009 Encounter for other general counseling and advice on contraception: Secondary | ICD-10-CM

## 2021-12-13 DIAGNOSIS — Z30011 Encounter for initial prescription of contraceptive pills: Secondary | ICD-10-CM

## 2021-12-13 MED ORDER — NORGESTIM-ETH ESTRAD TRIPHASIC 0.18/0.215/0.25 MG-35 MCG PO TABS: 1.0000 | ORAL_TABLET | Freq: Every day | ORAL | 0 refills | Status: DC

## 2021-12-13 NOTE — Progress Notes (Addendum)
11 weeks 2 days post depo. Denies problems with depo but desires to switch to OCP.  States used OCP as BCM in 2015 or 2016 and no problems.  Denies history of blood clots or high BP. Does not smoke.   Consulted with Hilario Quarry FNP and verbal order to start [place] today or can wait until Sunday (6/11).  Also instruct pt she may have nausea and possibly headache initial 7 days on OCP.  No back up method necessary since has had depo injection in March.  Instructions given by RN.  Dispensed Trisprintec 28 days 6 packs per verbal order Hilario Quarry FNP .  Instructions given to pt to begin on Sunday 12/17/21. OCP consent reviewed with pt and signed.  Annual PE due around 04/05/22.  Appt reminder given to pt.   Assisted by Interpreter Marlene [place] ([place]).  Tonny Branch, RN

## 2021-12-14 NOTE — Progress Notes (Signed)
Consulted by RN re: patient situation.  Reviewed RN note and agree that it reflects our discussion and my recommendations.  ° ° °Dejean Tribby, FNP  °

## 2022-05-16 ENCOUNTER — Emergency Department: Payer: Self-pay

## 2022-05-16 ENCOUNTER — Encounter: Payer: Self-pay | Admitting: Emergency Medicine

## 2022-05-16 ENCOUNTER — Emergency Department
Admission: EM | Admit: 2022-05-16 | Discharge: 2022-05-16 | Disposition: A | Discharge status: Home / Self Care | Payer: Self-pay | Attending: Emergency Medicine | Admitting: Emergency Medicine

## 2022-05-16 ENCOUNTER — Other Ambulatory Visit: Payer: Self-pay

## 2022-05-16 DIAGNOSIS — R112 Nausea with vomiting, unspecified: Secondary | ICD-10-CM | POA: Insufficient documentation

## 2022-05-16 DIAGNOSIS — E860 Dehydration: Secondary | ICD-10-CM

## 2022-05-16 DIAGNOSIS — R1030 Lower abdominal pain, unspecified: Secondary | ICD-10-CM | POA: Insufficient documentation

## 2022-05-16 DIAGNOSIS — K529 Noninfective gastroenteritis and colitis, unspecified: Secondary | ICD-10-CM

## 2022-05-16 LAB — CBC
HCT: 39.9 % (ref 36.0–46.0)
Hemoglobin: 13.5 g/dL (ref 12.0–15.0)
MCH: 28.8 pg (ref 26.0–34.0)
MCHC: 33.8 g/dL (ref 30.0–36.0)
MCV: 85.1 fL (ref 80.0–100.0)
Platelets: 202 10*3/uL (ref 150–400)
RBC: 4.69 MIL/uL (ref 3.87–5.11)
RDW: 12.6 % (ref 11.5–15.5)
WBC: 5.4 10*3/uL (ref 4.0–10.5)
nRBC: 0 % (ref 0.0–0.2)

## 2022-05-16 LAB — COMPREHENSIVE METABOLIC PANEL
ALT: 22 U/L (ref 0–44)
AST: 20 U/L (ref 15–41)
Albumin: 4.5 g/dL (ref 3.5–5.0)
Alkaline Phosphatase: 71 U/L (ref 38–126)
Anion gap: 8 (ref 5–15)
BUN: 7 mg/dL (ref 6–20)
CO2: 24 mmol/L (ref 22–32)
Calcium: 9.1 mg/dL (ref 8.9–10.3)
Chloride: 106 mmol/L (ref 98–111)
Creatinine, Ser: 0.63 mg/dL (ref 0.44–1.00)
GFR, Estimated: >60 mL/min (ref >60)
Glucose, Bld: 83 mg/dL (ref 70–99)
Potassium: 3.9 mmol/L (ref 3.5–5.1)
Sodium: 138 mmol/L (ref 135–145)
Total Bilirubin: 0.6 mg/dL (ref 0.3–1.2)
Total Protein: 8.2 g/dL — ABNORMAL HIGH (ref 6.5–8.1)

## 2022-05-16 LAB — URINALYSIS, ROUTINE W REFLEX MICROSCOPIC
Bacteria, UA: NONE SEEN
Bilirubin Urine: NEGATIVE
Glucose, UA: NEGATIVE mg/dL
Ketones, ur: 80 mg/dL — AB
Nitrite: NEGATIVE
Protein, ur: 30 mg/dL — AB
Specific Gravity, Urine: 1.028 (ref 1.005–1.030)
pH: 6 (ref 5.0–8.0)

## 2022-05-16 LAB — POC URINE PREG, ED: Preg Test, Ur: NEGATIVE

## 2022-05-16 LAB — LIPASE, BLOOD: Lipase: 34 U/L (ref 11–51)

## 2022-05-16 MED ORDER — SODIUM CHLORIDE 0.9 % IV BOLUS
1000.0000 mL | Freq: Once | INTRAVENOUS | Status: AC
  Administered 2022-05-16: 1000 mL via INTRAVENOUS

## 2022-05-16 MED ORDER — DICYCLOMINE HCL 10 MG PO CAPS: 10.0000 mg | ORAL_CAPSULE | Freq: Three times a day (TID) | ORAL | 0 refills | Status: DC | PRN

## 2022-05-16 MED ORDER — KETOROLAC TROMETHAMINE 30 MG/ML IJ SOLN
15.0000 mg | Freq: Once | INTRAMUSCULAR | Status: AC
  Administered 2022-05-16: 15 mg via INTRAVENOUS
  Filled 2022-05-16: qty 1

## 2022-05-16 MED ORDER — ONDANSETRON 4 MG PO TBDP: 4.0000 mg | ORAL_TABLET | Freq: Three times a day (TID) | ORAL | 0 refills | Status: DC | PRN

## 2022-05-16 MED ORDER — ONDANSETRON HCL 4 MG/2ML IJ SOLN
4.0000 mg | Freq: Once | INTRAMUSCULAR | Status: AC
  Administered 2022-05-16: 4 mg via INTRAVENOUS
  Filled 2022-05-16: qty 2

## 2022-05-16 NOTE — ED Triage Notes (Signed)
Patient sent to ED via POV from [facility] for LUQ pain w/ N/V and lack of appetite. Vomiting has been ongoing since last Wednesday. Unable to eat or drink much.

## 2022-05-16 NOTE — ED Provider Notes (Signed)
[facility] Provider Note    Event Date/Time   First MD Initiated Contact with Patient 05/16/22 1116     (approximate)  History   Chief Complaint: Abdominal Pain  HPI  Norma Becker is a 26 y.o. female with no significant past medical history presents to the emergency department for upper abdominal discomfort nausea and vomiting over the past 1 week.  According to the patient for the past week she has been experiencing mild upper abdominal discomfort as well as nausea and vomiting.  Denies any diarrhea but states she does feel constipated.  Patient states this occurred shortly after [place] a petting zoo and she was concerned that she could have contracted some sort of illness.  Patient denies any fever.  No dysuria.  No vaginal bleeding or discharge.  Last menstrual cycle 2 weeks ago.  Physical Exam   Triage Vital Signs: ED Triage Vitals  Enc Vitals Group     BP 05/16/22 1049 120/75     Pulse Rate 05/16/22 1049 91     Resp 05/16/22 1049 18     Temp 05/16/22 1049 98.3 F (36.8 C)     Temp Source 05/16/22 1049 Oral     SpO2 05/16/22 1049 99 %     Weight 05/16/22 1052 118 lb (53.5 kg)     Height 05/16/22 1052 5' (1.524 m)     Head Circumference --      Peak Flow --      Pain Score --      Pain Loc --      Pain Edu? --      Excl. in GC? --     Most recent vital signs: Vitals:   05/16/22 1049  BP: 120/75  Pulse: 91  Resp: 18  Temp: 98.3 F (36.8 C)  SpO2: 99%    General: Awake, no distress.  CV:  Good peripheral perfusion.  Regular rate and rhythm  Resp:  Normal effort.  Equal breath sounds bilaterally.  Abd:  No distention.  Soft, slight suprapubic tenderness otherwise benign abdomen.  No upper abdominal tenderness.  ED Results / Procedures / Treatments    RADIOLOGY  I have reviewed and interpreted the x-ray images.  I do not see any signs of obstruction on my evaluation. Radiology has read the x-ray is essentially  negative   MEDICATIONS ORDERED IN ED: Medications  sodium chloride 0.9 % bolus 1,000 mL (has no administration in time range)  ondansetron (ZOFRAN) injection 4 mg (has no administration in time range)  ketorolac (TORADOL) 30 MG/ML injection 15 mg (has no administration in time range)     IMPRESSION / MDM / ASSESSMENT AND PLAN / ED COURSE  I reviewed the triage vital signs and the nursing notes.  Patient's presentation is most consistent with acute presentation with potential threat to life or bodily function.  Patient presents emergency department for 1 week of nausea and vomiting.  Sent from urgent care for further evaluation.  Overall the patient appears well, no distress.  Patient has an overall benign abdomen with just very slight suprapubic tenderness.  Denies any urinary symptoms vaginal bleeding or discharge.  Patient's lab work is reassuring including a normal CBC, normal chemistry with normal LFTs and a normal lipase.  We will treat the patient's nausea with IV Zofran, IV fluids we will also dose Toradol.  Urinalysis and pregnancy test are pending.  X-rays negative.  Patient's labs are largely within normal limits.  Urinalysis shows ketones  but otherwise no concerning findings.  Pregnancy test is negative.  Patient is feeling somewhat better after medications discussed with the patient supportive care at home we will also prescribe Zofran and Bentyl to be used.  Discussed the importance of oral hydration.  Patient to follow-up with her doctor.  Patient agreeable to plan of care.  FINAL CLINICAL IMPRESSION(S) / ED DIAGNOSES   Nausea vomiting   Note:  This document was prepared using Dragon voice recognition software and may include unintentional dictation errors.   Harvest Dark, MD 05/16/22 1323

## 2022-05-29 ENCOUNTER — Ambulatory Visit (LOCAL_COMMUNITY_HEALTH_CENTER): Payer: Self-pay

## 2022-05-29 VITALS — BP 110/68 | Ht 60.0 in | Wt 119.0 lb

## 2022-05-29 DIAGNOSIS — Z3009 Encounter for other general counseling and advice on contraception: Secondary | ICD-10-CM

## 2022-05-29 DIAGNOSIS — Z30011 Encounter for initial prescription of contraceptive pills: Secondary | ICD-10-CM

## 2022-05-29 MED ORDER — NORGESTIM-ETH ESTRAD TRIPHASIC 0.18/0.215/0.25 MG-35 MCG PO TABS: 1.0000 | ORAL_TABLET | Freq: Every day | ORAL | 0 refills | Status: DC

## 2022-05-29 NOTE — Progress Notes (Signed)
In nurse clinic for ocp refill (Tri Sprintec). M [place], interpreter.   Denies missing any pills and has not run out. On last pack.  Takes ocp at same time each day.   Order for TriSprintec #6 packs on 12/13/2021 by Elveria Rising, FNP complete.   Pt due for annual PE and has appt scheduled 06/12/2022.   The patient was dispensed TriSprintec #1 pack today per SO Dr Karyl Kinnier. I provided counseling today regarding the medication. We discussed the medication, the side effects and when to call clinic. Patient given the opportunity to ask questions. Questions answered. Verbalizes understanding. Jerel Shepherd, RN

## 2022-06-04 ENCOUNTER — Telehealth: Payer: Self-pay | Admitting: Family Medicine

## 2022-06-04 NOTE — Telephone Encounter (Signed)
Pt. started the pk of BC/pills and she is getting very nauseas and throwing up; she would like for someone to call her to see if she can switch them for another kind. Spanish speaking, please use an interpreter. Thanks

## 2022-06-12 ENCOUNTER — Ambulatory Visit: Payer: Self-pay

## 2022-06-12 ENCOUNTER — Ambulatory Visit (LOCAL_COMMUNITY_HEALTH_CENTER): Payer: Self-pay | Admitting: Family Medicine

## 2022-06-12 ENCOUNTER — Encounter: Payer: Self-pay | Admitting: Family Medicine

## 2022-06-12 VITALS — BP 103/69 | Ht 60.0 in | Wt 117.0 lb

## 2022-06-12 DIAGNOSIS — Z113 Encounter for screening for infections with a predominantly sexual mode of transmission: Secondary | ICD-10-CM

## 2022-06-12 DIAGNOSIS — Z3009 Encounter for other general counseling and advice on contraception: Secondary | ICD-10-CM

## 2022-06-12 DIAGNOSIS — Z01419 Encounter for gynecological examination (general) (routine) without abnormal findings: Secondary | ICD-10-CM

## 2022-06-12 DIAGNOSIS — B379 Candidiasis, unspecified: Secondary | ICD-10-CM

## 2022-06-12 LAB — WET PREP FOR TRICH, YEAST, CLUE
Trichomonas Exam: NEGATIVE
Yeast Exam: NEGATIVE

## 2022-06-12 MED ORDER — MEDROXYPROGESTERONE ACETATE 150 MG/ML IM SUSP
150.0000 mg | INTRAMUSCULAR | Status: AC
  Administered 2022-06-12 – 2023-04-25 (×5): 150 mg via INTRAMUSCULAR

## 2022-06-12 MED ORDER — CLOTRIMAZOLE-BETAMETHASONE 1-0.05 % EX CREA: 1.0000 | TOPICAL_CREAM | Freq: Every day | CUTANEOUS | 0 refills | Status: AC

## 2022-06-12 NOTE — Progress Notes (Cosign Needed Addendum)
[facility] [place] Number: [phone]  Family Planning Visit- Repeat Yearly Visit  Subjective:  Norma Becker is a 26 y.o. [id number]  being seen today for an annual wellness visit and to discuss contraception options.   The patient is currently using Oral Contraceptive for pregnancy prevention. Patient does not want a pregnancy in the next year.    report they are looking for a method that provides High efficacy at preventing pregnancy   Patient has the following medical problems: does not have any active problems on file.  Chief Complaint  Patient presents with   Contraception    Physical and birth control     Patient reports to clinic for PE and to change birth control method.   See flowsheet for other program required questions.   Body mass index is 22.85 kg/m. - Patient is eligible for diabetes screening based on BMI and age [id number]?  not applicable [id number] ordered? not applicable  Patient reports 1 of partners in last year. Desires STI screening?  Yes   Has patient been screened once for HCV in the past?  No  No results found for: "HCVAB"  Does the patient have current of drug use, have a partner with drug use, and/or has been incarcerated since last result? No  If yes-- Screen for HCV through [facility]   Does the patient meet criteria for HBV testing? No  Criteria:  -Household, sexual or needle sharing contact with HBV -History of drug use -HIV positive -Those with known Hep C   Health Maintenance Due  Topic Date Due   HPV VACCINES (3 - 3-dose series) 12/09/2013   Hepatitis C Screening  Never done   INFLUENZA VACCINE  02/06/2022    Review of Systems  Gastrointestinal:  Positive for nausea.  Genitourinary:  Positive for frequency.  All other systems reviewed and are negative.   The following portions of the patient's history were reviewed and updated as appropriate: allergies,  current medications, past family history, past medical history, past social history, past surgical history and problem list. Problem list updated.  Objective:   Vitals:   06/12/22 0933  BP: 103/69  Weight: 117 lb (53.1 kg)  Height: 5' (1.524 m)    Physical Exam Vitals and nursing note reviewed.  Constitutional:      Appearance: Normal appearance.  HENT:     Head: Normocephalic and atraumatic.     Mouth/Throat:     Mouth: Mucous membranes are moist.     Pharynx: Oropharynx is clear. No oropharyngeal exudate or posterior oropharyngeal erythema.  Cardiovascular:     Rate and Rhythm: Normal rate and regular rhythm.  Pulmonary:     Effort: Pulmonary effort is normal.  Abdominal:     General: Abdomen is flat.     Palpations: There is no mass.     Tenderness: There is no abdominal tenderness. There is no rebound.  Genitourinary:    General: Normal vulva.     Exam position: Lithotomy position.     Pubic Area: No rash or pubic lice.      Labia:        Right: No rash or lesion.        Left: No rash or lesion.      Vagina: Vaginal discharge present. No erythema, bleeding or lesions.     Cervix: No cervical motion tenderness, discharge, friability, lesion or erythema.     Uterus: Normal.  Adnexa: Right adnexa normal and left adnexa normal.     Rectum: Normal.     Comments: pH = 4  Moderate amount of white discharge in vaginal canal. Lymphadenopathy:     Head:     Right side of head: No preauricular or posterior auricular adenopathy.     Left side of head: No preauricular or posterior auricular adenopathy.     Cervical: No cervical adenopathy.     Upper Body:     Right upper body: No supraclavicular, axillary or epitrochlear adenopathy.     Left upper body: No supraclavicular, axillary or epitrochlear adenopathy.     Lower Body: No right inguinal adenopathy. No left inguinal adenopathy.  Skin:    General: Skin is warm and dry.     Findings: No rash.  Neurological:      Mental Status: She is alert and oriented to person, place, and time.  Psychiatric:        Mood and Affect: Mood normal.        Behavior: Behavior normal.       Assessment and Plan:  Norma Becker is a 26 y.o. female [phone] presenting to the [facility] Department for an yearly wellness and contraception visit   Contraception counseling: Reviewed options based on patient desire and reproductive life plan. Patient is interested in Hormonal Injection. This was provided to the patient today.   Risks, benefits, and typical effectiveness rates were reviewed.  Questions were answered.  Written information was also given to the patient to review.    The patient will follow up in  3 months for surveillance.  The patient was told to call with any further questions, or with any concerns about this method of contraception.  Emphasized use of condoms 100% of the time for STI prevention.  Patient was assessed for need for ECP. ECP not indicated based on last sexual encounter of 11/19, while being on OCPs.   1. Well woman exam with routine gynecological exam. Last PAP (03/2021, NIL, HPV negative), next due 03/2024 Last CBE (03/2021- normal), next due 03/2024 -Nausea d/t OCPs, see Family planning A&P -urinary freq due to recent UTI (improved, on abx)  -Patient currently taking abx for UTI (diagnosed 11/30).  2. Family planning Patient on OCPs, states she has been nauseated with this. Called office last week asking about the nausea- instructed to take the pills at bedtime with snack, this didn't change nausea.  Patient wants to switch to depo.   - medroxyPROGESTERone (DEPO-PROVERA) injection 150 mg  3. Screening for venereal disease Patient accepted screenings including  vaginal CT/GC and wet prep. Patient meets criteria for HepB screening? No. Ordered? No - not indicated Patient meets criteria for HepC screening? No. Ordered? No - not indicated  Treat wet prep per standing  order Discussed time line for State Lab results and that patient will be called with positive results and encouraged patient to call if she had not heard in 2 weeks.  Counseled to return or seek care for continued or worsening symptoms Recommended condom use with all sex  Patient is currently using Hormonal Contraception: Injection, Rings and Patches to prevent pregnancy.   - [place], [place], CLUE  4. Candidiasis Pt currently being tx for UTI. Has moderate amount of white discharge, endorses mild itching. Wet prep negative- but given current abx use, and symptoms- will tx for yeast.   - clotrimazole-betamethasone (LOTRISONE) cream; Apply 1 Application topically daily  for 5 days.  Dispense: 5 g; Refill: 0     Return if symptoms worsen or fail to improve.  Total time spent 30 minutes.   Lenice Llamas, [place]

## 2022-06-12 NOTE — Progress Notes (Signed)
Pt here for physical examination and birth control.  Wants to change method from OCPs to Depo.  Wet mount results reviewed with pt utilizing [facility] interpreter.  Clotrimazole Vaginal Cream, 1 applicator at bedtime for 7 nights dispensed to patient.  Depo Provera 150mg  IM given in L deltoid without complications.  Pt encouraged no sex for next 7 days but if sexually active to use condoms as backup.  Family planning packet , RN

## 2022-08-28 ENCOUNTER — Ambulatory Visit (LOCAL_COMMUNITY_HEALTH_CENTER): Payer: Self-pay

## 2022-08-28 VITALS — BP 111/71 | Ht 60.0 in | Wt 118.0 lb

## 2022-08-28 DIAGNOSIS — Z3009 Encounter for other general counseling and advice on contraception: Secondary | ICD-10-CM

## 2022-08-28 DIAGNOSIS — Z3042 Encounter for surveillance of injectable contraceptive: Secondary | ICD-10-CM

## 2022-08-28 NOTE — Progress Notes (Signed)
11 weeks 0 days post depo. Voices no concerns. Depo given per order by Art Buff, FNP dated 06/12/2022. Tolerated well R delt. Next depo due 11/13/2022, has reminder. Lang line [phone] served as interpreter. Josie Saunders, RN

## 2022-09-03 NOTE — Addendum Note (Signed)
Addended by: Sharlet Salina on: 09/03/2022 11:36 AM   Modules accepted: Level of Service

## 2022-11-13 ENCOUNTER — Ambulatory Visit (LOCAL_COMMUNITY_HEALTH_CENTER): Payer: Self-pay

## 2022-11-13 ENCOUNTER — Ambulatory Visit: Payer: Self-pay

## 2022-11-13 VITALS — BP 108/75 | Ht 60.0 in | Wt 125.0 lb

## 2022-11-13 DIAGNOSIS — Z3009 Encounter for other general counseling and advice on contraception: Secondary | ICD-10-CM

## 2022-11-13 DIAGNOSIS — Z3042 Encounter for surveillance of injectable contraceptive: Secondary | ICD-10-CM

## 2022-11-13 NOTE — Progress Notes (Signed)
11 weeks post hormonal injection.  Medroxyprogesterone Acetate 150 mg given per order dated 06/12/2022 x 1 year by Aliene Altes, FNP.  Given left deltoid. Tolerated well. Reminder card provided to call for next appointment for injection due 01/29/2023.

## 2023-01-30 ENCOUNTER — Ambulatory Visit (LOCAL_COMMUNITY_HEALTH_CENTER): Payer: Self-pay

## 2023-01-30 ENCOUNTER — Ambulatory Visit: Payer: Self-pay

## 2023-01-30 VITALS — BP 102/74 | Ht 60.0 in | Wt 129.0 lb

## 2023-01-30 DIAGNOSIS — Z3042 Encounter for surveillance of injectable contraceptive: Secondary | ICD-10-CM

## 2023-01-30 DIAGNOSIS — Z3009 Encounter for other general counseling and advice on contraception: Secondary | ICD-10-CM

## 2023-01-30 NOTE — Progress Notes (Signed)
11w 1d post depo. Voices no concerns. Depo given today per order by Aliene Altes,  FNP dated 06/12/2022. Tolerated well in R deltoid. Next depo due 04/17/2023.   Language line interpreter, Dois Davenport [phone], helped during this visit.  Abagail Kitchens, RN

## 2023-04-25 ENCOUNTER — Ambulatory Visit (LOCAL_COMMUNITY_HEALTH_CENTER): Payer: Self-pay

## 2023-04-25 VITALS — BP 112/73 | Ht 60.0 in | Wt 133.0 lb

## 2023-04-25 DIAGNOSIS — Z3042 Encounter for surveillance of injectable contraceptive: Secondary | ICD-10-CM

## 2023-04-25 DIAGNOSIS — Z3009 Encounter for other general counseling and advice on contraception: Secondary | ICD-10-CM

## 2023-04-25 NOTE — Progress Notes (Signed)
12w 1d post depo. Voices no concerns. Depo given today per order by Aliene Altes, FNP dated 06/12/2022. Tolerated well in R deltoid (patient preference). Patient physical due 06/14/2023; patient plans to schedule physical with next depo due on 07/11/2023. All questions answered and verbalizes understanding.  Abagail Kitchens, RN

## 2023-07-16 ENCOUNTER — Ambulatory Visit: Payer: Self-pay

## 2023-07-16 ENCOUNTER — Encounter: Payer: Self-pay | Admitting: Advanced Practice Midwife

## 2023-07-16 VITALS — BP 107/68 | HR 87 | Ht 60.0 in | Wt 132.2 lb

## 2023-07-16 DIAGNOSIS — Z3009 Encounter for other general counseling and advice on contraception: Secondary | ICD-10-CM

## 2023-07-16 DIAGNOSIS — Z3042 Encounter for surveillance of injectable contraceptive: Secondary | ICD-10-CM

## 2023-07-16 DIAGNOSIS — Z30013 Encounter for initial prescription of injectable contraceptive: Secondary | ICD-10-CM

## 2023-07-16 LAB — WET PREP FOR TRICH, YEAST, CLUE
Trichomonas Exam: NEGATIVE
Yeast Exam: NEGATIVE

## 2023-07-16 MED ORDER — MEDROXYPROGESTERONE ACETATE 150 MG/ML IM SUSP
150.0000 mg | INTRAMUSCULAR | Status: AC
  Administered 2023-07-16 – 2024-03-03 (×4): 150 mg via INTRAMUSCULAR

## 2023-07-16 NOTE — Progress Notes (Signed)
 [facility] [address] [place] [address] Main phone: [phone]  Family Planning Visit - Initial Visit  Subjective:  Norma Becker is a 28 y.o.  [id number]   being seen today for an initial annual visit and to discuss reproductive life planning.  The patient is currently using Hormonal Injection for pregnancy prevention. Patient reports   does not want a pregnancy in the next year.    Patient reports they are looking for a method that provides High efficacy at preventing pregnancy  Patient has the following medical conditions does not have any active problems on file.  Chief Complaint  Patient presents with   Annual Exam    PE and Depo    Patient reports here for yearly physical and DMPA. Not having menses with DMPA. Last DMPA 04/25/23.  Last sex 07/12/23 without condom; with current partner x 8 years; 1 partner in last 3 mo.  Last ETOH 2022. Last dental exam 2023. Highest grade completed 9th. Happy with DMPA. Not working and not in school. Living with FOB and her 2 girls. Last pap 04/04/21 neg HPV neg. Last PE 06/12/22  Patient denies cigs, vaping ,cigars, MJ  Diabetes screening This patient is 28 y.o. with a BMI of Body mass index is 25.82 kg/m..  Is this patient eligible for diabetes screening based on BMI> 3 and age >35?  not applicable Was Hgb A1c ordered? not applicable  STI screening Patient reports 1 of partners in last year.  Does this patient desire STI screening?  No - declines bloodwork  Hepatitis C screening Has patient been screened once for HCV in the past?  Yes  No results found for: HCVAB  Does the patient meet criteria for HCV testing? No If yes-- Screen for HCV through [facility] Criteria:  Since the last HCV result, does the patient have any of the following? - Current drug use - Have a partner with drug use - Has been incarcerated  Hepatitis B screening Does the  patient meet criteria for HBV testing? No Criteria:  -Household, sexual or needle sharing contact with HBV -History of drug use -HIV positive -Those with known Hep C  Health Maintenance Due  Topic Date Due   HPV VACCINES (3 - 3-dose series) 11/03/2013   Hepatitis C Screening  Never done   INFLUENZA VACCINE  02/07/2023   COVID-19 Vaccine (1 - 2024-25 season) Never done    Review of Systems  All other systems reviewed and are negative.   The following portions of the patient's history were reviewed and updated as appropriate: allergies, current medications, past family history, past medical history, past social history, past surgical history and problem list. Problem list updated.  See flowsheet for other program required questions.  Objective:   Vitals:   07/16/23 1053  BP: 107/68  Pulse: 87  Weight: 132 lb 3.2 oz (60 kg)  Height: 5' (1.524 m)    Physical Exam Constitutional:      Appearance: Normal appearance. She is normal weight.  HENT:     Head: Normocephalic and atraumatic.     Mouth/Throat:     Mouth: Mucous membranes are moist.     Comments: Last dental exam 2023 Eyes:     Conjunctiva/sclera: Conjunctivae normal.  Neck:     Thyroid: No thyroid mass, thyromegaly or thyroid tenderness.  Cardiovascular:     Rate and Rhythm: Normal rate and regular rhythm.  Pulmonary:  Effort: Pulmonary effort is normal.     Breath sounds: Normal breath sounds.  Chest:  Breasts:    Right: Normal.     Left: Normal.  Abdominal:     Palpations: Abdomen is soft.     Comments: Soft without masses or tenderness, fair tone  Genitourinary:    General: Normal vulva.     Exam position: Lithotomy position.     Pubic Area: No pubic lice.      Vagina: Vaginal discharge (clear leukorrhea, ph<4.5) present.     Cervix: Normal.     Uterus: Normal.      Adnexa: Right adnexa normal and left adnexa normal.     Rectum: Normal.     Comments: No evidence of nits Musculoskeletal:         General: Normal range of motion.     Cervical back: Normal range of motion and neck supple.  Skin:    General: Skin is warm and dry.  Neurological:     Mental Status: She is alert.  Psychiatric:        Mood and Affect: Mood normal.   Ivan Roche chaperone  Assessment and Plan:  Norma Becker is a 28 y.o. female presenting to the [facility] Department for an initial annual wellness/contraceptive visit  Contraception counseling: Reviewed options based on patient desire and reproductive life plan. Patient is interested in Hormonal Injection. This was provided to the patient today. Not due yet.  if not why not clearly documented  Risks, benefits, and typical effectiveness rates were reviewed.  Questions were answered.  Written information was also given to the patient to review.    The patient will follow up in  11-13 weeks for surveillance.  The patient was told to call with any further questions, or with any concerns about this method of contraception.  Emphasized use of condoms 100% of the time for STI prevention.  Educated on ECP and assessed for need of ECP. Patient reported not meeting criteria.  Reviewed options and patient desired No method of ECP, declined all    1. Family planning (Primary) Treat wet mount per standing orders Immunization nurse consult  - WET PREP FOR TRICH, YEAST, CLUE - Chlamydia/Gonorrhea [place] [facility]  2. Encounter for surveillance of injectable contraceptive DMPA 150 mg IM q 11-13 wks x 1 year    No follow-ups on file.  No future appointments.  Almarie DELENA Hillier, CNM

## 2023-07-16 NOTE — Progress Notes (Addendum)
 Pt is here for PE, STD testing and Depo injection.  Wet mount results reviewed, no treatment required per SO. PT declines STD testing. Depo 150 mg #1 given IM in Lt deltoid.   Pt tolerated well. FP packet and reminder card given for pt to return in 11-13 weeks for next Depo.  Maudie CHRISTELLA Edison, RN

## 2023-10-01 ENCOUNTER — Ambulatory Visit (LOCAL_COMMUNITY_HEALTH_CENTER): Payer: Self-pay

## 2023-10-01 VITALS — BP 108/73 | Ht 60.0 in | Wt 133.5 lb

## 2023-10-01 DIAGNOSIS — Z3042 Encounter for surveillance of injectable contraceptive: Secondary | ICD-10-CM

## 2023-10-01 DIAGNOSIS — Z3009 Encounter for other general counseling and advice on contraception: Secondary | ICD-10-CM

## 2023-10-01 NOTE — Progress Notes (Signed)
 11w 0d post depo. Voices no concerns. Depo given today per order by Hazle Coca, CNM dated 07/16/2023. Tolerated well in R deltoid. Next depo due 12/17/2023; patient aware.   Interpreter, Caryn Bee [phone], helped during this visit.  Abagail Kitchens, RN

## 2023-12-03 ENCOUNTER — Other Ambulatory Visit: Payer: Self-pay | Admitting: Orthopedic Surgery

## 2023-12-03 DIAGNOSIS — M25561 Pain in right knee: Secondary | ICD-10-CM

## 2023-12-05 ENCOUNTER — Ambulatory Visit
Admission: RE | Admit: 2023-12-05 | Discharge: 2023-12-05 | Disposition: A | Discharge status: Home / Self Care | Payer: Self-pay | Source: Ambulatory Visit | Attending: Orthopedic Surgery | Admitting: Orthopedic Surgery

## 2023-12-05 DIAGNOSIS — M25561 Pain in right knee: Secondary | ICD-10-CM

## 2023-12-17 ENCOUNTER — Ambulatory Visit: Payer: Self-pay

## 2023-12-17 VITALS — BP 104/61 | Ht 60.0 in | Wt 135.0 lb

## 2023-12-17 DIAGNOSIS — Z3009 Encounter for other general counseling and advice on contraception: Secondary | ICD-10-CM

## 2023-12-17 DIAGNOSIS — Z30013 Encounter for initial prescription of injectable contraceptive: Secondary | ICD-10-CM

## 2023-12-17 DIAGNOSIS — Z3042 Encounter for surveillance of injectable contraceptive: Secondary | ICD-10-CM

## 2023-12-17 NOTE — Progress Notes (Signed)
 11 Weeks   0 Days since last Depo Interpreter: pacific interpreter [phone]   Voices no concerns today.  Counseled to adhere to 11 to 13 week intervals between depo injections for optimal benefit.  Depo given today per order by Azzie Bollman, CNM  dated 07/16/2023.  Tolerated well L delt.  Next depo due 03/03/2024,  has reminder card.  Mirinda Monte, RN

## 2024-03-03 ENCOUNTER — Ambulatory Visit (LOCAL_COMMUNITY_HEALTH_CENTER): Payer: Self-pay

## 2024-03-03 VITALS — BP 108/71 | Ht 60.0 in | Wt 134.5 lb

## 2024-03-03 DIAGNOSIS — Z3009 Encounter for other general counseling and advice on contraception: Secondary | ICD-10-CM

## 2024-03-03 DIAGNOSIS — Z3042 Encounter for surveillance of injectable contraceptive: Secondary | ICD-10-CM

## 2024-03-03 DIAGNOSIS — Z30013 Encounter for initial prescription of injectable contraceptive: Secondary | ICD-10-CM

## 2024-03-03 NOTE — Progress Notes (Signed)
 11 weeks and 0 days post depo. Voices no concerns. Depo given today per order by FORBES Hillier, CNM dated 07/16/2023. Tolerated well in R deltoid. Next depo due 05/20/2024; patient aware.  Interpreter, Edra [phone], helped during this visit.   Doyce CINDERELLA Shuck, RN

## 2024-04-03 ENCOUNTER — Other Ambulatory Visit: Payer: Self-pay

## 2024-04-03 ENCOUNTER — Encounter: Payer: Self-pay | Admitting: Emergency Medicine

## 2024-04-03 ENCOUNTER — Emergency Department: Payer: MEDICAID

## 2024-04-03 ENCOUNTER — Emergency Department
Admission: EM | Admit: 2024-04-03 | Discharge: 2024-04-03 | Disposition: A | Discharge status: Home / Self Care | Payer: MEDICAID | Attending: Emergency Medicine | Admitting: Emergency Medicine

## 2024-04-03 DIAGNOSIS — K5900 Constipation, unspecified: Secondary | ICD-10-CM | POA: Insufficient documentation

## 2024-04-03 DIAGNOSIS — R109 Unspecified abdominal pain: Secondary | ICD-10-CM | POA: Insufficient documentation

## 2024-04-03 LAB — CBC
HCT: 41.6 % (ref 36.0–46.0)
Hemoglobin: 14.2 g/dL (ref 12.0–15.0)
MCH: 29.6 pg (ref 26.0–34.0)
MCHC: 34.1 g/dL (ref 30.0–36.0)
MCV: 86.7 fL (ref 80.0–100.0)
Platelets: 178 K/uL (ref 150–400)
RBC: 4.8 MIL/uL (ref 3.87–5.11)
RDW: 12.9 % (ref 11.5–15.5)
WBC: 7.1 K/uL (ref 4.0–10.5)
nRBC: 0 % (ref 0.0–0.2)

## 2024-04-03 LAB — URINALYSIS, ROUTINE W REFLEX MICROSCOPIC
Bacteria, UA: NONE SEEN
Bilirubin Urine: NEGATIVE
Glucose, UA: NEGATIVE mg/dL
Ketones, ur: NEGATIVE mg/dL
Nitrite: NEGATIVE
Protein, ur: NEGATIVE mg/dL
Specific Gravity, Urine: 1.017 (ref 1.005–1.030)
pH: 6 (ref 5.0–8.0)

## 2024-04-03 LAB — COMPREHENSIVE METABOLIC PANEL WITH GFR
ALT: 14 U/L (ref 0–44)
AST: 16 U/L (ref 15–41)
Albumin: 4.5 g/dL (ref 3.5–5.0)
Alkaline Phosphatase: 78 U/L (ref 38–126)
Anion gap: 9 (ref 5–15)
BUN: 9 mg/dL (ref 6–20)
CO2: 22 mmol/L (ref 22–32)
Calcium: 9.1 mg/dL (ref 8.9–10.3)
Chloride: 107 mmol/L (ref 98–111)
Creatinine, Ser: 0.56 mg/dL (ref 0.44–1.00)
GFR, Estimated: >60 mL/min (ref >60)
Glucose, Bld: 90 mg/dL (ref 70–99)
Potassium: 3.9 mmol/L (ref 3.5–5.1)
Sodium: 138 mmol/L (ref 135–145)
Total Bilirubin: 0.8 mg/dL (ref 0.0–1.2)
Total Protein: 8 g/dL (ref 6.5–8.1)

## 2024-04-03 LAB — POC URINE PREG, ED: Preg Test, Ur: NEGATIVE

## 2024-04-03 MED ORDER — LACTATED RINGERS IV BOLUS
1000.0000 mL | Freq: Once | INTRAVENOUS | Status: AC
  Administered 2024-04-03: 1000 mL via INTRAVENOUS

## 2024-04-03 MED ORDER — POLYETHYLENE GLYCOL 3350 17 GM/SCOOP PO POWD: 17.0000 g | Freq: Every day | ORAL | 0 refills | Status: AC

## 2024-04-03 MED ORDER — ONDANSETRON HCL 4 MG/2ML IJ SOLN
4.0000 mg | Freq: Once | INTRAMUSCULAR | Status: AC
  Administered 2024-04-03: 4 mg via INTRAVENOUS
  Filled 2024-04-03: qty 2

## 2024-04-03 MED ORDER — KETOROLAC TROMETHAMINE 30 MG/ML IJ SOLN
15.0000 mg | Freq: Once | INTRAMUSCULAR | Status: AC
Start: 2024-04-03 — End: 2024-04-03
  Administered 2024-04-03: 15 mg via INTRAVENOUS
  Filled 2024-04-03: qty 1

## 2024-04-03 NOTE — ED Triage Notes (Signed)
 Patient to ED via POV for left side flank pain. Ongoing since Wednesday. States burning after urinating and nausea.

## 2024-04-03 NOTE — Discharge Instructions (Addendum)
 Please take your MiraLAX  as prescribed you may use 1 capful once or twice daily with a large glass of water until you have multiple bowel movements.  Please drink plenty of fluids.  Return to the emergency department for any worsening pain, fever, or any other symptom personally concerning to yourself.

## 2024-04-03 NOTE — ED Provider Notes (Signed)
 [facility] Provider Note    Event Date/Time   First MD Initiated Contact with Patient 04/03/24 [phone]     (approximate)  History   Chief Complaint: Flank Pain  HPI  Norma Becker is a 28 y.o. female with no significant past medical history who presents to the emergency department for left flank pain.  According to the patient for the past 2 days she has been experiencing a pain on her left flank.  Patient states pain comes and goes.  Denies any dysuria but states some discomfort after she urinates.  No hematuria.  No history of kidney stones.  No vomiting or diarrhea.  Does not have menstrual cycles due to Depo-Provera .  Physical Exam   Triage Vital Signs: ED Triage Vitals  Encounter Vitals Group     BP 04/03/24 0841 (!) 145/86     Girls Systolic BP Percentile --      Girls Diastolic BP Percentile --      Boys Systolic BP Percentile --      Boys Diastolic BP Percentile --      Pulse Rate 04/03/24 0841 89     Resp 04/03/24 0841 17     Temp 04/03/24 0841 98.4 F (36.9 C)     Temp Source 04/03/24 0841 Oral     SpO2 04/03/24 0841 99 %     Weight 04/03/24 0840 134 lb 7.7 oz (61 kg)     Height 04/03/24 0840 5' (1.524 m)     Head Circumference --      Peak Flow --      Pain Score 04/03/24 0840 7     Pain Loc --      Pain Education --      Exclude from Growth Chart --     Most recent vital signs: Vitals:   04/03/24 0841  BP: (!) 145/86  Pulse: 89  Resp: 17  Temp: 98.4 F (36.9 C)  SpO2: 99%    General: Awake, no distress.  CV:  Good peripheral perfusion.  Regular rate and rhythm  Resp:  Normal effort.  Equal breath sounds bilaterally.  Abd:  No distention.  Soft, nontender.    ED Results / Procedures / Treatments   RADIOLOGY  I have reviewed interpret the CT images.  No obvious large kidney stone seen on my evaluation. Radiology has read the CT scan as negative.  On my evaluation patient does appear to have significant  constipation.   MEDICATIONS ORDERED IN ED: Medications  lactated ringers  bolus 1,000 mL (has no administration in time range)  ondansetron  (ZOFRAN ) injection 4 mg (has no administration in time range)  ketorolac  (TORADOL ) 30 MG/ML injection 15 mg (has no administration in time range)     IMPRESSION / MDM / ASSESSMENT AND PLAN / ED COURSE  I reviewed the triage vital signs and the nursing notes.  Patient's presentation is most consistent with acute presentation with potential threat to life or bodily function.  Patient presents to the emergency department for left flank pain.  Patient states symptoms have been ongoing and intermittent over the last 2 days.  Differential is quite broad but would include urinary tract infection, pyelonephritis, ovarian cysts, diverticulitis, kidney stone, constipation, etc.  We will check labs, urinalysis.  Will continue to closely monitor will IV hydrate treat discomfort with Toradol  and Zofran  while awaiting results.  Patient's workup is overall reassuring with a normal CBC with a normal white blood cell count, reassuring chemistry.  Urinalysis  shows some red cells and white cells but no bacteria.  Pregnancy test negative.  Patient CT scan is negative for acute abnormality however on my review does appear to have significant constipation.  Will place the patient on MiraLAX  have the patient follow-up with her doctor for recheck/reevaluation.  Patient agreeable to plan of care.  FINAL CLINICAL IMPRESSION(S) / ED DIAGNOSES   Flank pain Constipation  Note:  This document was prepared using Dragon voice recognition software and may include unintentional dictation errors.   Dorothyann Drivers, MD 04/03/24 1113

## 2024-04-24 ENCOUNTER — Emergency Department: Payer: Self-pay

## 2024-04-24 ENCOUNTER — Other Ambulatory Visit: Payer: Self-pay

## 2024-04-24 DIAGNOSIS — J189 Pneumonia, unspecified organism: Secondary | ICD-10-CM | POA: Diagnosis not present

## 2024-04-24 DIAGNOSIS — R509 Fever, unspecified: Secondary | ICD-10-CM | POA: Diagnosis present

## 2024-04-24 LAB — RESP PANEL BY RT-PCR (RSV, FLU A&B, COVID)  RVPGX2
Influenza A by PCR: NEGATIVE
Influenza B by PCR: NEGATIVE
Resp Syncytial Virus by PCR: NEGATIVE
SARS Coronavirus 2 by RT PCR: NEGATIVE

## 2024-04-24 LAB — BASIC METABOLIC PANEL WITH GFR
Anion gap: 9 (ref 5–15)
BUN: 10 mg/dL (ref 6–20)
CO2: 20 mmol/L — ABNORMAL LOW (ref 22–32)
Calcium: 9 mg/dL (ref 8.9–10.3)
Chloride: 108 mmol/L (ref 98–111)
Creatinine, Ser: 0.62 mg/dL (ref 0.44–1.00)
GFR, Estimated: >60 mL/min (ref >60)
Glucose, Bld: 127 mg/dL — ABNORMAL HIGH (ref 70–99)
Potassium: 3.5 mmol/L (ref 3.5–5.1)
Sodium: 137 mmol/L (ref 135–145)

## 2024-04-24 LAB — CBC
HCT: 38.5 % (ref 36.0–46.0)
Hemoglobin: 13.2 g/dL (ref 12.0–15.0)
MCH: 29.8 pg (ref 26.0–34.0)
MCHC: 34.3 g/dL (ref 30.0–36.0)
MCV: 86.9 fL (ref 80.0–100.0)
Platelets: 184 K/uL (ref 150–400)
RBC: 4.43 MIL/uL (ref 3.87–5.11)
RDW: 12.5 % (ref 11.5–15.5)
WBC: 7.6 K/uL (ref 4.0–10.5)
nRBC: 0 % (ref 0.0–0.2)

## 2024-04-24 LAB — TROPONIN I (HIGH SENSITIVITY): Troponin I (High Sensitivity): <2 ng/L (ref <18)

## 2024-04-24 LAB — POC URINE PREG, ED: Preg Test, Ur: NEGATIVE

## 2024-04-24 MED ADMIN — Acetaminophen Tab 325 MG: 650 mg | ORAL | NDC 00904677361

## 2024-04-24 MED FILL — Acetaminophen Tab 325 MG: 650.0000 mg | ORAL | Qty: 2 | Status: AC

## 2024-04-24 NOTE — ED Triage Notes (Signed)
 Pt presents to ER from home with complaints of chest pain. Pt reports she started to have shapr pains under her left axilla on Wednesday.Pt reports pain has moved to her left side of her chest, pt reports pressure on her chest and also feels lightheaded and dizziness. Pt talks in complete sentences no respiratory distress noted

## 2024-04-25 ENCOUNTER — Emergency Department
Admission: EM | Admit: 2024-04-25 | Discharge: 2024-04-25 | Disposition: A | Discharge status: Home / Self Care | Payer: Self-pay | Attending: Emergency Medicine | Admitting: Emergency Medicine

## 2024-04-25 DIAGNOSIS — J189 Pneumonia, unspecified organism: Secondary | ICD-10-CM

## 2024-04-25 MED ORDER — CEPHALEXIN 500 MG PO CAPS: 500.0000 mg | ORAL_CAPSULE | Freq: Two times a day (BID) | ORAL | 0 refills | Status: AC

## 2024-04-25 MED ORDER — AZITHROMYCIN 500 MG PO TABS
500.0000 mg | ORAL_TABLET | Freq: Once | ORAL | Status: AC
  Administered 2024-04-25: 500 mg via ORAL
  Filled 2024-04-25: qty 1

## 2024-04-25 MED ORDER — CEPHALEXIN 500 MG PO CAPS
500.0000 mg | ORAL_CAPSULE | Freq: Once | ORAL | Status: AC
  Administered 2024-04-25: 500 mg via ORAL
  Filled 2024-04-25: qty 1

## 2024-04-25 MED ORDER — AZITHROMYCIN 250 MG PO TABS: ORAL_TABLET | ORAL | 0 refills | Status: AC

## 2024-04-25 NOTE — ED Provider Notes (Signed)
 [facility] Provider Note    Event Date/Time   First MD Initiated Contact with Patient 04/25/24 0032     (approximate)  History   Chief Complaint: Chest Pain  HPI  Norma Becker is a 28 y.o. female with no significant past medical history who presents to the emergency department for left-sided chest discomfort.  According to the patient for the past 2 days she has been experiencing a vague pain in the left chest that she states has moved from under her armpit now to the anterior part of her left chest.  Patient states tonight she was also feeling somewhat lightheaded and dizzy so she came to the emergency department for evaluation.  Patient states no cough at home however she is coughing multiple times in the emergency department.  Patient did not know she had a fever currently 100.4 in the emergency department.  Patient denies any shortness of breath.  No vomiting.  Physical Exam   Triage Vital Signs: ED Triage Vitals  Encounter Vitals Group     BP 04/24/24 2257 (!) 132/96     Girls Systolic BP Percentile --      Girls Diastolic BP Percentile --      Boys Systolic BP Percentile --      Boys Diastolic BP Percentile --      Pulse Rate 04/24/24 2257 87     Resp 04/24/24 2257 16     Temp 04/24/24 2257 (!) 100.4 F (38 C)     Temp Source 04/24/24 2257 Oral     SpO2 04/24/24 2257 100 %     Weight 04/24/24 2252 134 lb (60.8 kg)     Height 04/24/24 2252 5' (1.524 m)     Head Circumference --      Peak Flow --      Pain Score 04/24/24 2251 7     Pain Loc --      Pain Education --      Exclude from Growth Chart --     Most recent vital signs: Vitals:   04/24/24 2257  BP: (!) 132/96  Pulse: 87  Resp: 16  Temp: (!) 100.4 F (38 C)  SpO2: 100%    General: Awake, no distress.  CV:  Good peripheral perfusion.  Regular rate and rhythm  Resp:  Normal effort.  Equal breath sounds bilaterally.  Chest wall is nontender to palpation.  No obvious  wheeze rales or rhonchi. Abd:  No distention.  Soft, nontender.  No rebound or guarding.  ED Results / Procedures / Treatments   EKG  EKG viewed and interpreted by myself shows a normal sinus rhythm at 97 bpm with a narrow QRS, normal axis, normal intervals, no concerning ST changes  RADIOLOGY  I have reviewed and interpreted the chest x-ray images.  Patient does appear to have a mild opacity in the left upper lung. Radiology has read the x-ray is most consistent with left upper lobe pneumonia.  MEDICATIONS ORDERED IN ED: Medications  azithromycin (ZITHROMAX) tablet 500 mg (has no administration in time range)  cephALEXin (KEFLEX) capsule 500 mg (has no administration in time range)  acetaminophen  (TYLENOL ) tablet 650 mg (650 mg Oral Given 04/24/24 2258)     IMPRESSION / MDM / ASSESSMENT AND PLAN / ED COURSE  I reviewed the triage vital signs and the nursing notes.  Patient's presentation is most consistent with acute presentation with potential threat to life or bodily function.  Patient presents to the emergency  department for 2 to 3 days of left-sided chest pain.  Here the patient is found to be febrile to 100.4 with a occasional cough during my examination.  Satting 100% normal respiratory rate normal pulse rate.  Reassuring physical exam patient speaking in full sentences without any distress or discomfort noted.  Spanish interpreter used for this evaluation.  Lab work today is reassuring with a normal CBC with a normal white blood cell count reassuring chemistry negative troponin negative respiratory panel and negative pregnancy test.  Chest x-ray is consistent with left upper lobe pneumonia.  Given the patient's cough in the emergency department low-grade temperature this fits the patient's clinical picture well.  Will start the patient on Zithromax and Keflex.  Will have her follow-up with her PCP.  Patient agreeable to plan of care.  Discussed return precautions.  FINAL CLINICAL  IMPRESSION(S) / ED DIAGNOSES   Community-acquired pneumonia    Note:  This document was prepared using Dragon voice recognition software and may include unintentional dictation errors.   Dorothyann Drivers, MD 04/25/24 [phone]

## 2024-04-25 NOTE — Discharge Instructions (Signed)
 Please take antibiotics as prescribed for their entire course.  Please follow-up with your primary care doctor within the next several days for recheck/reevaluation.  Return to the emergency department for any worsening chest pain trouble breathing or any other symptom concerning to yourself.  Please follow-up with your primary care provider for a repeat chest x-ray in 4 to 6 weeks to ensure that the pneumonia has resolved.

## 2024-05-21 ENCOUNTER — Ambulatory Visit: Payer: Self-pay

## 2024-05-21 VITALS — BP 109/76 | Wt 127.5 lb

## 2024-05-21 DIAGNOSIS — Z3009 Encounter for other general counseling and advice on contraception: Secondary | ICD-10-CM

## 2024-05-21 DIAGNOSIS — Z30013 Encounter for initial prescription of injectable contraceptive: Secondary | ICD-10-CM

## 2024-05-21 DIAGNOSIS — Z3042 Encounter for surveillance of injectable contraceptive: Secondary | ICD-10-CM

## 2024-05-21 MED ORDER — MEDROXYPROGESTERONE ACETATE 150 MG/ML IM SUSP
150.0000 mg | Freq: Once | INTRAMUSCULAR | Status: AC
  Administered 2024-05-21: 150 mg via INTRAMUSCULAR

## 2024-05-21 NOTE — Addendum Note (Signed)
 Addended byBETHA ANDRIA GIVENS on: 05/21/2024 06:02 PM   Modules accepted: Level of Service

## 2024-05-21 NOTE — Progress Notes (Signed)
 11weeks 2 days post last depo Voices no concerns. Depo given IM L delt per S.O. today Pt is due for annual exam + depo .Advised pt needed to schedule together/see provider when due for next depo Jan .29,2026. Has reminder card

## 2024-06-09 ENCOUNTER — Other Ambulatory Visit: Payer: Self-pay | Admitting: Physician Assistant

## 2024-06-09 DIAGNOSIS — J189 Pneumonia, unspecified organism: Secondary | ICD-10-CM

## 2024-08-06 ENCOUNTER — Ambulatory Visit

## 2024-08-06 ENCOUNTER — Ambulatory Visit: Admitting: Family Medicine

## 2024-08-06 ENCOUNTER — Encounter: Payer: Self-pay | Admitting: Family Medicine

## 2024-08-06 VITALS — BP 105/71 | HR 75 | Ht 60.0 in | Wt 123.4 lb

## 2024-08-06 DIAGNOSIS — Z30013 Encounter for initial prescription of injectable contraceptive: Secondary | ICD-10-CM

## 2024-08-06 DIAGNOSIS — Z3009 Encounter for other general counseling and advice on contraception: Secondary | ICD-10-CM

## 2024-08-06 DIAGNOSIS — Z0000 Encounter for general adult medical examination without abnormal findings: Secondary | ICD-10-CM

## 2024-08-06 DIAGNOSIS — Z309 Encounter for contraceptive management, unspecified: Secondary | ICD-10-CM

## 2024-08-06 MED ORDER — MEDROXYPROGESTERONE ACETATE 150 MG/ML IM SUSP
150.0000 mg | INTRAMUSCULAR | Status: AC
  Administered 2024-08-06: 150 mg via INTRAMUSCULAR

## 2024-08-06 NOTE — Progress Notes (Signed)
 Pt is here PE and Depo. Depo IM injection given to pt at the Lt Deltoid and pt tolerated well to the injection with no complications. Condoms declined and reminder card given. Opportunity given to Patient to ask questions for any clarifications, questions answered. Wilkie Drought, RN.
